# Patient Record
Sex: Male | Born: 1955 | Hispanic: No | Marital: Married | Smoking: Never smoker
Health system: Southern US, Community
[De-identification: ages and names within clinical notes are randomized; demographics above are authoritative.]

## PROBLEM LIST (undated history)

## (undated) DIAGNOSIS — T7840XA Allergy, unspecified, initial encounter: Secondary | ICD-10-CM

## (undated) DIAGNOSIS — J302 Other seasonal allergic rhinitis: Secondary | ICD-10-CM

## (undated) DIAGNOSIS — I10 Essential (primary) hypertension: Secondary | ICD-10-CM

## (undated) DIAGNOSIS — J45909 Unspecified asthma, uncomplicated: Secondary | ICD-10-CM

## (undated) DIAGNOSIS — E785 Hyperlipidemia, unspecified: Secondary | ICD-10-CM

## (undated) HISTORY — DX: Unspecified asthma, uncomplicated: J45.909

## (undated) HISTORY — DX: Essential (primary) hypertension: I10

## (undated) HISTORY — DX: Hyperlipidemia, unspecified: E78.5

## (undated) HISTORY — PX: CARDIAC VALVE SURGERY: SHX40

## (undated) HISTORY — PX: HERNIA REPAIR: SHX51

## (undated) HISTORY — DX: Allergy, unspecified, initial encounter: T78.40XA

## (undated) HISTORY — DX: Other seasonal allergic rhinitis: J30.2

---

## 1998-02-04 ENCOUNTER — Ambulatory Visit (HOSPITAL_COMMUNITY): Admission: RE | Admit: 1998-02-04 | Discharge: 1998-02-04 | Payer: Self-pay | Admitting: General Surgery

## 1999-12-06 ENCOUNTER — Inpatient Hospital Stay (HOSPITAL_COMMUNITY): Admission: RE | Admit: 1999-12-06 | Discharge: 1999-12-07 | Payer: Self-pay | Admitting: Orthopedic Surgery

## 1999-12-06 ENCOUNTER — Encounter: Payer: Self-pay | Admitting: Orthopedic Surgery

## 1999-12-06 ENCOUNTER — Encounter (INDEPENDENT_AMBULATORY_CARE_PROVIDER_SITE_OTHER): Payer: Self-pay | Admitting: Specialist

## 2008-11-16 ENCOUNTER — Ambulatory Visit: Payer: Self-pay | Admitting: Pulmonary Disease

## 2008-11-16 DIAGNOSIS — R0609 Other forms of dyspnea: Secondary | ICD-10-CM

## 2008-11-16 DIAGNOSIS — R0989 Other specified symptoms and signs involving the circulatory and respiratory systems: Secondary | ICD-10-CM

## 2010-09-01 NOTE — Op Note (Signed)
[place]  Patient:    Andrew Murray                 MRN: [id number] Proc. Date: 12/06/99 Adm. Date:  [id number] Attending:  Skip Mayer                           Operative Report  SURGEON:  Georges Lynch. Darrelyn Hillock, M.D.  ASSISTANT:  Illene Labrador. [place], M.D.  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 Note: The patient had a transitional vertebra, great care was taken to make sure that the films were labeled in the operating room exactly like they were preoperatively.  We had the radiologist confirm the space.  At this time, patient was given 1 g IV Ancef.  He was placed on the operating table and the spinal table.  Sterile prep and draping of his back was carried out.  Two needles were placed in the back for localization purposes and an x-ray was taken.  At this time, we identified the appropriate L5-S1 space and an incision was made, bleeders were identified and cauterized.  The muscle was stripped from the lamina and spinous process.  A self-retaining retractors were inserted.  We then went down, took another x-ray and verified the exact position and this was confirmed by the radiologist.  We then carried out a hemilaminectomy in the usual fashion of the L5-S1 on the left.  We removed the ligamentum flavum in the usual fashion.  We protected the dura at all times. We then went down and identified the dura and the S1 root and gently retracted those.  We then went down and noted a large herniated disk at L5-S1.  A cruciate incision was made in the disk space.  We then utilized the Epstein curets to literally tease out all of the herniated disk material.  We then inserted the pituitary rongeurs and completed the diskectomy.  We made multiple passes under the posterior longitudinal ligament and up under the nerve root to make sure there were no other fragments.  We went down, cleaned the space out of  all the loose material.  We had a very nice decompression. We decompressed the lateral recesses as well.  We probed the axilla of the S1 root with the Penfield 4 to make sure there were no loose fragments there either.  We then utilized the hockey-stick and traced the nerve down into the foramen and had a nice opening there.  We thoroughly irrigated out the area. Good hemostasis was maintained with thrombin-soaked Gelfoam.  The wound was closed in layers in usual fashion.  I injected about 6 cc of 0.5% Marcaine into the muscle site.  A sterile Neosporin dressing was applied.  The patient left the operating room in satisfactory condition. DD:  12/06/99 TD:  12/07/99 Job: [id number]

## 2017-09-20 ENCOUNTER — Other Ambulatory Visit: Payer: Self-pay

## 2017-09-20 ENCOUNTER — Ambulatory Visit (INDEPENDENT_AMBULATORY_CARE_PROVIDER_SITE_OTHER): Payer: Managed Care, Other (non HMO) | Admitting: Emergency Medicine

## 2017-09-20 ENCOUNTER — Encounter: Payer: Self-pay | Admitting: Emergency Medicine

## 2017-09-20 VITALS — HR 63 | Temp 98.4°F | Resp 16 | Ht 68.0 in | Wt 221.0 lb

## 2017-09-20 DIAGNOSIS — E785 Hyperlipidemia, unspecified: Secondary | ICD-10-CM | POA: Insufficient documentation

## 2017-09-20 DIAGNOSIS — Z0000 Encounter for general adult medical examination without abnormal findings: Secondary | ICD-10-CM | POA: Diagnosis not present

## 2017-09-20 DIAGNOSIS — I10 Essential (primary) hypertension: Secondary | ICD-10-CM | POA: Insufficient documentation

## 2017-09-20 NOTE — Patient Instructions (Addendum)
   IF you received an x-ray today, you will receive an invoice from [facility]. Please contact [facility] at [phone] with questions or concerns regarding your invoice.   IF you received labwork today, you will receive an invoice from [facility]. Please contact [facility] at [phone] with questions or concerns regarding your invoice.   Our billing staff will not be able to assist you with questions regarding bills from these companies.  You will be contacted with the lab results as soon as they are available. The fastest way to get your results is to activate your My Chart account. Instructions are located on the last page of this paperwork. If you have not heard from us regarding the results in 2 weeks, please contact this office.      Health Maintenance, Male A healthy lifestyle and preventive care is important for your health and wellness. Ask your health care provider about what schedule of regular examinations is right for you. What should I know about weight and diet? Eat a Healthy Diet  Eat plenty of vegetables, fruits, whole grains, low-fat dairy products, and lean protein.  Do not eat a lot of foods high in solid fats, added sugars, or salt.  Maintain a Healthy Weight Regular exercise can help you achieve or maintain a healthy weight. You should:  Do at least 150 minutes of exercise each week. The exercise should increase your heart rate and make you sweat (moderate-intensity exercise).  Do strength-training exercises at least twice a week.  Watch Your Levels of Cholesterol and Blood Lipids  Have your blood tested for lipids and cholesterol every 5 years starting at 63 years of age. If you are at high risk for heart disease, you should start having your blood tested when you are 63 years old. You may need to have your cholesterol levels checked more often if: ? Your lipid or cholesterol levels are high. ? You are older than 63 years of age. ? You  are at high risk for heart disease.  What should I know about cancer screening? Many types of cancers can be detected early and may often be prevented. Lung Cancer  You should be screened every year for lung cancer if: ? You are a current smoker who has smoked for at least 30 years. ? You are a former smoker who has quit within the past 15 years.  Talk to your health care provider about your screening options, when you should start screening, and how often you should be screened.  Colorectal Cancer  Routine colorectal cancer screening usually begins at 63 years of age and should be repeated every 5-10 years until you are 63 years old. You may need to be screened more often if early forms of precancerous polyps or small growths are found. Your health care provider may recommend screening at an earlier age if you have risk factors for colon cancer.  Your health care provider may recommend using home test kits to check for hidden blood in the stool.  A small camera at the end of a tube can be used to examine your colon (sigmoidoscopy or colonoscopy). This checks for the earliest forms of colorectal cancer.  Prostate and Testicular Cancer  Depending on your age and overall health, your health care provider may do certain tests to screen for prostate and testicular cancer.  Talk to your health care provider about any symptoms or concerns you have about testicular or prostate cancer.  Skin Cancer  Check your skin   from head to toe regularly.  Tell your health care provider about any new moles or changes in moles, especially if: ? There is a change in a mole's size, shape, or color. ? You have a mole that is larger than a pencil eraser.  Always use sunscreen. Apply sunscreen liberally and repeat throughout the day.  Protect yourself by wearing long sleeves, pants, a wide-brimmed hat, and sunglasses when outside.  What should I know about heart disease, diabetes, and high blood  pressure?  If you are 18-39 years of age, have your blood pressure checked every 3-5 years. If you are 40 years of age or older, have your blood pressure checked every year. You should have your blood pressure measured twice-once when you are at a hospital or clinic, and once when you are not at a hospital or clinic. Record the average of the two measurements. To check your blood pressure when you are not at a hospital or clinic, you can use: ? An automated blood pressure machine at a pharmacy. ? A home blood pressure monitor.  Talk to your health care provider about your target blood pressure.  If you are between 45-79 years old, ask your health care provider if you should take aspirin to prevent heart disease.  Have regular diabetes screenings by checking your fasting blood sugar level. ? If you are at a normal weight and have a low risk for diabetes, have this test once every three years after the age of 45. ? If you are overweight and have a high risk for diabetes, consider being tested at a younger age or more often.  A one-time screening for abdominal aortic aneurysm (AAA) by ultrasound is recommended for men aged 65-75 years who are current or former smokers. What should I know about preventing infection? Hepatitis B If you have a higher risk for hepatitis B, you should be screened for this virus. Talk with your health care provider to find out if you are at risk for hepatitis B infection. Hepatitis C Blood testing is recommended for:  Everyone born from 1945 through 1965.  Anyone with known risk factors for hepatitis C.  Sexually Transmitted Diseases (STDs)  You should be screened each year for STDs including gonorrhea and chlamydia if: ? You are sexually active and are younger than 63 years of age. ? You are older than 63 years of age and your health care provider tells you that you are at risk for this type of infection. ? Your sexual activity has changed since you were last  screened and you are at an increased risk for chlamydia or gonorrhea. Ask your health care provider if you are at risk.  Talk with your health care provider about whether you are at high risk of being infected with HIV. Your health care provider may recommend a prescription medicine to help prevent HIV infection.  What else can I do?  Schedule regular health, dental, and eye exams.  Stay current with your vaccines (immunizations).  Do not use any tobacco products, such as cigarettes, chewing tobacco, and e-cigarettes. If you need help quitting, ask your health care provider.  Limit alcohol intake to no more than 2 drinks per day. One drink equals 12 ounces of beer, 5 ounces of wine, or 1 ounces of hard liquor.  Do not use street drugs.  Do not share needles.  Ask your health care provider for help if you need support or information about quitting drugs.  Tell your health care   provider if you often feel depressed.  Tell your health care provider if you have ever been abused or do not feel safe at home. This information is not intended to replace advice given to you by your health care provider. Make sure you discuss any questions you have with your health care provider. Document Released: 09/29/2007 Document Revised: 11/30/2015 Document Reviewed: 01/04/2015 Elsevier Interactive Patient Education  2018 Elsevier Inc.  

## 2017-09-20 NOTE — Progress Notes (Signed)
[employer] 62 y.o.   Chief Complaint  Patient presents with  . Establish Care  . Annual Exam    per patient had breakfast an hour ago    HISTORY OF PRESENT ILLNESS: This is a 62 y.o. male Here for annual exam; no complaints and no medical concerns. Has history of hypertension and high cholesterol.  On medications.  From [place].  Last saw his doctor 1 to 2 months ago and everything was okay.  Will be traveling frequently.   HPI   Prior to Admission medications   Medication Sig Start Date End Date Taking? Authorizing Provider  aspirin EC 81 MG tablet Take 81 mg by mouth daily.   Yes [provider]  rosuvastatin (CRESTOR) 20 MG tablet Take 20 mg by mouth daily.   Yes [provider]    Allergies  Allergen Reactions  . Sulfonamide Derivatives     REACTION: rash    Patient Active Problem List   Diagnosis Date Noted  . SNORING 11/16/2008    No past medical history on file.    Social History   Socioeconomic History  . Marital status: Married    Spouse name: Not on file  . Number of children: Not on file  . Years of education: Not on file  . Highest education level: Not on file  Occupational History  . Not on file  Social Needs  . Financial resource strain: Not on file  . Food insecurity:    Worry: Not on file    Inability: Not on file  . Transportation needs:    Medical: Not on file    Non-medical: Not on file  Tobacco Use  . Smoking status: Never Smoker  . Smokeless tobacco: Never Used  Substance and Sexual Activity  . Alcohol use: Never    Frequency: Never  . Drug use: Never  . Sexual activity: Not on file  Lifestyle  . Physical activity:    Days per week: Not on file    Minutes per session: Not on file  . Stress: Not on file  Relationships  . Social connections:    Talks on phone: Not on file    Gets together: Not on file    Attends religious service: Not on file    Active member of club or organization: Not on file      Attends meetings of clubs or organizations: Not on file    Relationship status: Not on file  . Intimate partner violence:    Fear of current or ex partner: Not on file    Emotionally abused: Not on file    Physically abused: Not on file    Forced sexual activity: Not on file  Other Topics Concern  . Not on file  Social History Narrative  . Not on file    No family history on file.   Review of Systems  Constitutional: Negative.  Negative for chills, fever and malaise/fatigue.  HENT: Negative.  Negative for congestion, hearing loss, nosebleeds and sore throat.   Eyes: Negative.  Negative for blurred vision and double vision.  Respiratory: Negative.  Negative for cough and shortness of breath.   Cardiovascular: Negative.  Negative for chest pain and palpitations.  Gastrointestinal: Negative.  Negative for abdominal pain, nausea and vomiting.  Genitourinary: Negative.  Negative for dysuria and hematuria.  Musculoskeletal: Negative.  Negative for myalgias and neck pain.  Skin: Negative.  Negative for rash.  Neurological: Negative.  Negative for dizziness and headaches.  Endo/Heme/Allergies: Negative.   All other systems reviewed and are negative.   Vitals:   09/20/17 0903  Pulse: 63  Resp: 16  Temp: 98.4 F (36.9 C)  SpO2: 97%    Physical Exam  Constitutional: He is oriented to person, place, and time. He appears well-developed and well-nourished.  HENT:  Head: Normocephalic and atraumatic.  Right Ear: External ear normal.  Left Ear: External ear normal.  Nose: Nose normal.  Mouth/Throat: Oropharynx is clear and moist.  Eyes: Pupils are equal, round, and reactive to light. Conjunctivae and EOM are normal.  Neck: Normal range of motion. Neck supple. No JVD present. No thyromegaly present.  Cardiovascular: Normal rate, regular rhythm, normal heart sounds and intact distal pulses.  Pulmonary/Chest: Effort normal and breath sounds normal.  Abdominal: Soft. Bowel sounds  are normal. He exhibits no distension. There is no tenderness.  Musculoskeletal: Normal range of motion.  Neurological: He is alert and oriented to person, place, and time. No sensory deficit. He exhibits normal muscle tone.  Skin: Skin is warm and dry. Capillary refill takes less than 2 seconds.  Psychiatric: He has a normal mood and affect. His behavior is normal.  Vitals reviewed.    ASSESSMENT & PLAN: Karel Jarvis was seen today for establish care and annual exam.  Diagnoses and all orders for this visit:  Routine general medical examination at a health care facility -     Cancel: CBC with Differential -     Cancel: Comprehensive metabolic panel -     Cancel: Hemoglobin A1c -     Cancel: Lipid panel -     Cancel: PSA(Must document that pt has been informed of limitations of PSA testing.) -     Cancel: Hepatitis C antibody screen -     Cancel: HIV antibody -     CBC with Differential/Platelet; Future -     Comprehensive metabolic panel; Future -     Hemoglobin A1c; Future -     HIV antibody; Future -     Lipid panel; Future -     Hepatitis C antibody; Future  Essential hypertension  Dyslipidemia    Patient Instructions       IF you received an x-ray today, you will receive an invoice from [facility]. Please contact [facility] at [phone] with questions or concerns regarding your invoice.   IF you received labwork today, you will receive an invoice from [facility]. Please contact [facility] at [phone] with questions or concerns regarding your invoice.   Our billing staff will not be able to assist you with questions regarding bills from these companies.  You will be contacted with the lab results as soon as they are available. The fastest way to get your results is to activate your My Chart account. Instructions are located on the last page of this paperwork. If you have not heard from [place] regarding the results in 2 weeks, please contact this  office.      Health Maintenance, Male A healthy lifestyle and preventive care is important for your health and wellness. Ask your health care provider about what schedule of regular examinations is right for you. What should I know about weight and diet? Eat a Healthy Diet  Eat plenty of vegetables, fruits, whole grains, low-fat dairy products, and lean protein.  Do not eat a lot of foods high in solid fats, added sugars, or salt.  Maintain a Healthy Weight Regular exercise can help you achieve or maintain a healthy weight. You  should:  Do at least 150 minutes of exercise each week. The exercise should increase your heart rate and make you sweat (moderate-intensity exercise).  Do strength-training exercises at least twice a week.  Watch Your Levels of Cholesterol and Blood Lipids  Have your blood tested for lipids and cholesterol every 5 years starting at 63 years of age. If you are at high risk for heart disease, you should start having your blood tested when you are 63 years old. You may need to have your cholesterol levels checked more often if: ? Your lipid or cholesterol levels are high. ? You are older than 63 years of age. ? You are at high risk for heart disease.  What should I know about cancer screening? Many types of cancers can be detected early and may often be prevented. Lung Cancer  You should be screened every year for lung cancer if: ? You are a current smoker who has smoked for at least 30 years. ? You are a former smoker who has quit within the past 15 years.  Talk to your health care provider about your screening options, when you should start screening, and how often you should be screened.  Colorectal Cancer  Routine colorectal cancer screening usually begins at 63 years of age and should be repeated every 5-10 years until you are 63 years old. You may need to be screened more often if early forms of precancerous polyps or small growths are found. Your  health care provider may recommend screening at an earlier age if you have risk factors for colon cancer.  Your health care provider may recommend using home test kits to check for hidden blood in the stool.  A small camera at the end of a tube can be used to examine your colon (sigmoidoscopy or colonoscopy). This checks for the earliest forms of colorectal cancer.  Prostate and Testicular Cancer  Depending on your age and overall health, your health care provider may do certain tests to screen for prostate and testicular cancer.  Talk to your health care provider about any symptoms or concerns you have about testicular or prostate cancer.  Skin Cancer  Check your skin from head to toe regularly.  Tell your health care provider about any new moles or changes in moles, especially if: ? There is a change in a mole's size, shape, or color. ? You have a mole that is larger than a pencil eraser.  Always use sunscreen. Apply sunscreen liberally and repeat throughout the day.  Protect yourself by wearing long sleeves, pants, a wide-brimmed hat, and sunglasses when outside.  What should I know about heart disease, diabetes, and high blood pressure?  If you are 5418-63 years of age, have your blood pressure checked every 3-5 years. If you are 63 years of age or older, have your blood pressure checked every year. You should have your blood pressure measured twice-once when you are at a hospital or clinic, and once when you are not at a hospital or clinic. Record the average of the two measurements. To check your blood pressure when you are not at a hospital or clinic, you can use: ? An automated blood pressure machine at a pharmacy. ? A home blood pressure monitor.  Talk to your health care provider about your target blood pressure.  If you are between 645-63 years old, ask your health care provider if you should take aspirin to prevent heart disease.  Have regular diabetes screenings by  checking your  fasting blood sugar level. ? If you are at a normal weight and have a low risk for diabetes, have this test once every three years after the age of 24. ? If you are overweight and have a high risk for diabetes, consider being tested at a younger age or more often.  A one-time screening for abdominal aortic aneurysm (AAA) by ultrasound is recommended for men aged 65-75 years who are current or former smokers. What should I know about preventing infection? Hepatitis B If you have a higher risk for hepatitis B, you should be screened for this virus. Talk with your health care provider to find out if you are at risk for hepatitis B infection. Hepatitis C Blood testing is recommended for:  Everyone born from [birth year] through 1965.  Anyone with known risk factors for hepatitis C.  Sexually Transmitted Diseases (STDs)  You should be screened each year for STDs including gonorrhea and chlamydia if: ? You are sexually active and are younger than 63 years of age. ? You are older than 63 years of age and your health care provider tells you that you are at risk for this type of infection. ? Your sexual activity has changed since you were last screened and you are at an increased risk for chlamydia or gonorrhea. Ask your health care provider if you are at risk.  Talk with your health care provider about whether you are at high risk of being infected with HIV. Your health care provider may recommend a prescription medicine to help prevent HIV infection.  What else can I do?  Schedule regular health, dental, and eye exams.  Stay current with your vaccines (immunizations).  Do not use any tobacco products, such as cigarettes, chewing tobacco, and e-cigarettes. If you need help quitting, ask your health care provider.  Limit alcohol intake to no more than 2 drinks per day. One drink equals 12 ounces of beer, 5 ounces of wine, or 1 ounces of hard liquor.  Do not use street drugs.  Do not  share needles.  Ask your health care provider for help if you need support or information about quitting drugs.  Tell your health care provider if you often feel depressed.  Tell your health care provider if you have ever been abused or do not feel safe at home. This information is not intended to replace advice given to you by your health care provider. Make sure you discuss any questions you have with your health care provider. Document Released: 09/29/2007 Document Revised: 11/30/2015 Document Reviewed: 01/04/2015 Elsevier Interactive Patient Education  2018 [employer]vier Inc.      Edwina Barth, MD [facility]

## 2017-09-21 ENCOUNTER — Ambulatory Visit (INDEPENDENT_AMBULATORY_CARE_PROVIDER_SITE_OTHER): Payer: Managed Care, Other (non HMO) | Admitting: Emergency Medicine

## 2017-09-21 DIAGNOSIS — Z0000 Encounter for general adult medical examination without abnormal findings: Secondary | ICD-10-CM | POA: Diagnosis not present

## 2017-09-22 LAB — CBC WITH DIFFERENTIAL/PLATELET
BASOS ABS: 0 10*3/uL (ref 0.0–0.2)
Basos: 1 %
EOS (ABSOLUTE): 0.1 10*3/uL (ref 0.0–0.4)
EOS: 3 %
HEMOGLOBIN: 14.3 g/dL (ref 13.0–17.7)
Hematocrit: 43.3 % (ref 37.5–51.0)
IMMATURE GRANS (ABS): 0 10*3/uL (ref 0.0–0.1)
IMMATURE GRANULOCYTES: 0 %
LYMPHS ABS: 2.3 10*3/uL (ref 0.7–3.1)
LYMPHS: 42 %
MCH: 28.4 pg (ref 26.6–33.0)
MCHC: 33 g/dL (ref 31.5–35.7)
MCV: 86 fL (ref 79–97)
MONOCYTES: 9 %
Monocytes Absolute: 0.5 10*3/uL (ref 0.1–0.9)
NEUTROS PCT: 45 %
Neutrophils Absolute: 2.5 10*3/uL (ref 1.4–7.0)
Platelets: 279 10*3/uL (ref 150–450)
RBC: 5.03 x10E6/uL (ref 4.14–5.80)
RDW: 14.2 % (ref 12.3–15.4)
WBC: 5.4 10*3/uL (ref 3.4–10.8)

## 2017-09-22 LAB — HEPATITIS C ANTIBODY: Hep C Virus Ab: <0.1 s/co ratio (ref 0.0–0.9)

## 2017-09-22 LAB — LIPID PANEL
Chol/HDL Ratio: 2.9 ratio (ref 0.0–5.0)
Cholesterol, Total: 156 mg/dL (ref 100–199)
HDL: 53 mg/dL (ref >39)
LDL CALC: 84 mg/dL (ref 0–99)
Triglycerides: 93 mg/dL (ref 0–149)
VLDL CHOLESTEROL CAL: 19 mg/dL (ref 5–40)

## 2017-09-22 LAB — HEMOGLOBIN A1C
ESTIMATED AVERAGE GLUCOSE: 128 mg/dL
HEMOGLOBIN A1C: 6.1 % — AB (ref 4.8–5.6)

## 2017-09-22 LAB — COMPREHENSIVE METABOLIC PANEL
ALK PHOS: 61 IU/L (ref 39–117)
ALT: 21 IU/L (ref 0–44)
AST: 23 IU/L (ref 0–40)
Albumin/Globulin Ratio: 1.7 (ref 1.2–2.2)
Albumin: 4.5 g/dL (ref 3.6–4.8)
BILIRUBIN TOTAL: 0.7 mg/dL (ref 0.0–1.2)
BUN/Creatinine Ratio: 13 (ref 10–24)
BUN: 11 mg/dL (ref 8–27)
CHLORIDE: 103 mmol/L (ref 96–106)
CO2: 23 mmol/L (ref 20–29)
Calcium: 9.8 mg/dL (ref 8.6–10.2)
Creatinine, Ser: 0.88 mg/dL (ref 0.76–1.27)
GFR calc Af Amer: 106 mL/min/{1.73_m2} (ref >59)
GFR calc non Af Amer: 91 mL/min/{1.73_m2} (ref >59)
GLUCOSE: 99 mg/dL (ref 65–99)
Globulin, Total: 2.6 g/dL (ref 1.5–4.5)
Potassium: 4.5 mmol/L (ref 3.5–5.2)
Sodium: 140 mmol/L (ref 134–144)
Total Protein: 7.1 g/dL (ref 6.0–8.5)

## 2017-09-22 LAB — HIV ANTIBODY (ROUTINE TESTING W REFLEX): HIV SCREEN 4TH GENERATION: NONREACTIVE

## 2017-09-23 NOTE — Progress Notes (Signed)
Here for labs only

## 2017-11-01 ENCOUNTER — Other Ambulatory Visit: Payer: Self-pay

## 2017-11-01 ENCOUNTER — Encounter: Payer: Self-pay | Admitting: Emergency Medicine

## 2017-11-01 ENCOUNTER — Ambulatory Visit (INDEPENDENT_AMBULATORY_CARE_PROVIDER_SITE_OTHER): Payer: Managed Care, Other (non HMO) | Admitting: Emergency Medicine

## 2017-11-01 VITALS — BP 108/67 | HR 70 | Temp 98.7°F | Resp 16 | Ht 68.0 in | Wt 224.8 lb

## 2017-11-01 DIAGNOSIS — R399 Unspecified symptoms and signs involving the genitourinary system: Secondary | ICD-10-CM | POA: Insufficient documentation

## 2017-11-01 DIAGNOSIS — N400 Enlarged prostate without lower urinary tract symptoms: Secondary | ICD-10-CM

## 2017-11-01 DIAGNOSIS — Z23 Encounter for immunization: Secondary | ICD-10-CM | POA: Diagnosis not present

## 2017-11-01 LAB — POCT URINALYSIS DIP (MANUAL ENTRY)
BILIRUBIN UA: NEGATIVE
Blood, UA: NEGATIVE
Glucose, UA: NEGATIVE mg/dL
Ketones, POC UA: NEGATIVE mg/dL
Leukocytes, UA: NEGATIVE
Nitrite, UA: NEGATIVE
PH UA: 8 (ref 5.0–8.0)
Protein Ur, POC: NEGATIVE mg/dL
Spec Grav, UA: 1.015 (ref 1.010–1.025)
Urobilinogen, UA: 0.2 E.U./dL

## 2017-11-01 MED ORDER — DOXAZOSIN MESYLATE 2 MG PO TABS: 2.0000 mg | ORAL_TABLET | Freq: Every day | ORAL | 3 refills | Status: DC

## 2017-11-01 NOTE — Progress Notes (Signed)
Andrew Murray 62 y.o.   Chief Complaint  Patient presents with  . discuss prostate test    urinate then stop then starts again.  Per pt when he move around it feels like something is going to come out (urine)    HISTORY OF PRESENT ILLNESS: This is a 62 y.o. male complaining of urinary symptoms for several weeks.  Urinary stream is weak.  Unable to completely empty bladder.  Interrupted flow.  Gets up at least 3 times during the night to urinate.  Denies fever or chills.  Denies hematuria.  Denies pelvic or abdominal pain.  No other significant symptomatology  HPI   Prior to Admission medications   Medication Sig Start Date End Date Taking? Authorizing Provider  aspirin EC 81 MG tablet Take 81 mg by mouth daily.   Yes [provider]  rosuvastatin (CRESTOR) 20 MG tablet Take 20 mg by mouth daily.   Yes [provider]    Allergies  Allergen Reactions  . Sulfonamide Derivatives     REACTION: rash    Patient Active Problem List   Diagnosis Date Noted  . Essential hypertension 09/20/2017  . Dyslipidemia 09/20/2017  . SNORING 11/16/2008    Past Medical History:  Diagnosis Date  . Asthma   . Seasonal allergies     Past Surgical History:  Procedure Laterality Date  . HERNIA REPAIR      Social History   Socioeconomic History  . Marital status: Married    Spouse name: Not on file  . Number of children: Not on file  . Years of education: Not on file  . Highest education level: Not on file  Occupational History  . Not on file  Social Needs  . Financial resource strain: Not on file  . Food insecurity:    Worry: Not on file    Inability: Not on file  . Transportation needs:    Medical: Not on file    Non-medical: Not on file  Tobacco Use  . Smoking status: Never Smoker  . Smokeless tobacco: Never Used  Substance and Sexual Activity  . Alcohol use: Never    Frequency: Never  . Drug use: Never  . Sexual activity: Not on file    Lifestyle  . Physical activity:    Days per week: Not on file    Minutes per session: Not on file  . Stress: Not on file  Relationships  . Social connections:    Talks on phone: Not on file    Gets together: Not on file    Attends religious service: Not on file    Active member of club or organization: Not on file    Attends meetings of clubs or organizations: Not on file    Relationship status: Not on file  . Intimate partner violence:    Fear of current or ex partner: Not on file    Emotionally abused: Not on file    Physically abused: Not on file    Forced sexual activity: Not on file  Other Topics Concern  . Not on file  Social History Narrative  . Not on file    Family History  Problem Relation Age of Onset  . Diabetes Mother   . Diabetes Father   . Heart disease Father   . High Cholesterol Father      Review of Systems  Constitutional: Negative.  Negative for chills and fever.  HENT: Negative for sore throat.   Respiratory: Negative for cough  and shortness of breath.   Cardiovascular: Negative for chest pain and palpitations.  Gastrointestinal: Negative for abdominal pain, diarrhea, nausea and vomiting.  Genitourinary: Positive for frequency and urgency.  Skin: Negative.  Negative for rash.  Neurological: Negative.  Negative for dizziness and headaches.  Endo/Heme/Allergies: Negative.     Vitals:   11/01/17 1410  BP: 108/67  Pulse: 70  Resp: 16  Temp: 98.7 F (37.1 C)  SpO2: 97%    Physical Exam  Constitutional: He is oriented to person, place, and time. He appears well-developed and well-nourished.  HENT:  Head: Normocephalic.  Eyes: Pupils are equal, round, and reactive to light. EOM are normal.  Neck: Normal range of motion. Neck supple.  Cardiovascular: Normal rate and regular rhythm.  Pulmonary/Chest: Effort normal and breath sounds normal.  Abdominal: Soft. There is no tenderness.  Genitourinary: Prostate is enlarged. Prostate is not  tender.  Musculoskeletal: Normal range of motion.  Neurological: He is alert and oriented to person, place, and time. No sensory deficit. He exhibits normal muscle tone.  Skin: Skin is warm and dry. Capillary refill takes less than 2 seconds.  Psychiatric: He has a normal mood and affect. His behavior is normal.  Vitals reviewed.  Results for orders placed or performed in visit on 11/01/17 (from the past 24 hour(s))  POCT urinalysis dipstick     Status: None   Collection Time: 11/01/17  2:42 PM  Result Value Ref Range   Color, UA yellow yellow   Clarity, UA clear clear   Glucose, UA negative negative mg/dL   Bilirubin, UA negative negative   Ketones, POC UA negative negative mg/dL   Spec Grav, UA 1.610 9.604 - 1.025   Blood, UA negative negative   pH, UA 8.0 5.0 - 8.0   Protein Ur, POC negative negative mg/dL   Urobilinogen, UA 0.2 0.2 or 1.0 E.U./dL   Nitrite, UA Negative Negative   Leukocytes, UA Negative Negative    A total of 25 minutes was spent in the room with the patient, greater than 50% of which was in counseling/coordination of care regarding differential diagnosis, management, medications, need for follow-up with urologist.  ASSESSMENT & PLAN: Andrew Murray was seen today for discuss prostate test.  Diagnoses and all orders for this visit:  Lower urinary tract symptoms (LUTS) -     POCT urinalysis dipstick -     PSA -     Ambulatory referral to Urology  Need for Tdap vaccination -     Tdap vaccine greater than or equal to 7yo IM  Prostate enlargement  Other orders -     doxazosin (CARDURA) 2 MG tablet; Take 1 tablet (2 mg total) by mouth daily.     Patient Instructions       IF you received an x-ray today, you will receive an invoice from [facility]. Please contact [facility] at [phone] with questions or concerns regarding your invoice.   IF you received labwork today, you will receive an invoice from [facility]. Please contact [facility]  at [phone] with questions or concerns regarding your invoice.   Our billing staff will not be able to assist you with questions regarding bills from these companies.  You will be contacted with the lab results as soon as they are available. The fastest way to get your results is to activate your My Chart account. Instructions are located on the last page of this paperwork. If you have not heard from [place] regarding the results in 2 weeks,  please contact this office.     Benign Prostatic Hyperplasia Benign prostatic hyperplasia (BPH) is an enlarged prostate gland that is caused by the normal aging process and not by cancer. The prostate is a walnut-sized gland that is involved in the production of semen. It is located in front of the rectum and below the bladder. The bladder stores urine and the urethra is the tube that carries the urine out of the body. The prostate may get bigger as a man gets older. An enlarged prostate can press on the urethra. This can make it harder to pass urine. The build-up of urine in the bladder can cause infection. Back pressure and infection may progress to bladder damage and kidney (renal) failure. What are the causes? This condition is part of a normal aging process. However, not all men develop problems from this condition. If the prostate enlarges away from the urethra, urine flow will not be blocked. If it enlarges toward the urethra and compresses it, there will be problems passing urine. What increases the risk? This condition is more likely to develop in men over the age of 50 years. What are the signs or symptoms? Symptoms of this condition include:  Getting up often during the night to urinate.  Needing to urinate frequently during the day.  Difficulty starting urine flow.  Decrease in size and strength of your urine stream.  Leaking (dribbling) after urinating.  Inability to pass urine. This needs immediate treatment.  Inability to completely  empty your bladder.  Pain when you pass urine. This is more common if there is also an infection.  Urinary tract infection (UTI).  How is this diagnosed? This condition is diagnosed based on your medical history, a physical exam, and your symptoms. Tests will also be done, such as:  A post-void bladder scan. This measures any amount of urine that may remain in your bladder after you finish urinating.  A digital rectal exam. In a rectal exam, your health care provider checks your prostate by putting a lubricated, gloved finger into your rectum to feel the back of your prostate gland. This exam detects the size of your gland and any abnormal lumps or growths.  An exam of your urine (urinalysis).  A prostate specific antigen (PSA) screening. This is a blood test used to screen for prostate cancer.  An ultrasound. This test uses sound waves to electronically produce a picture of your prostate gland.  Your health care provider may refer you to a specialist in kidney and prostate diseases (urologist). How is this treated? Once symptoms begin, your health care provider will monitor your condition (active surveillance or watchful waiting). Treatment for this condition will depend on the severity of your condition. Treatment may include:  Observation and yearly exams. This may be the only treatment needed if your condition and symptoms are mild.  Medicines to relieve your symptoms, including: ? Medicines to shrink the prostate. ? Medicines to relax the muscle of the prostate.  Surgery in severe cases. Surgery may include: ? Prostatectomy. In this procedure, the prostate tissue is removed completely through an open incision or with a laparascope or robotics. ? Transurethral resection of the prostate (TURP). In this procedure, a tool is inserted through the opening at the tip of the penis (urethra). It is used to cut away tissue of the inner core of the prostate. The pieces are removed through  the same opening of the penis. This removes the blockage. ? Transurethral incision (TUIP). In this  procedure, small cuts are made in the prostate. This lessens the prostate's pressure on the urethra. ? Transurethral microwave thermotherapy (TUMT). This procedure uses microwaves to create heat. The heat destroys and removes a small amount of prostate tissue. ? Transurethral needle ablation (TUNA). This procedure uses radio frequencies to destroy and remove a small amount of prostate tissue. ? Interstitial laser coagulation (ILC). This procedure uses a laser to destroy and remove a small amount of prostate tissue. ? Transurethral electrovaporization (TUVP). This procedure uses electrodes to destroy and remove a small amount of prostate tissue. ? Prostatic urethral lift. This procedure inserts an implant to push the lobes of the prostate away from the urethra.  Follow these instructions at home:  Take over-the-counter and prescription medicines only as told by your health care provider.  Monitor your symptoms for any changes. Contact your health care provider with any changes.  Avoid drinking large amounts of liquid before going to bed or out in public.  Avoid or reduce how much caffeine or alcohol you drink.  Give yourself time when you urinate.  Keep all follow-up visits as told by your health care provider. This is important. Contact a health care provider if:  You have unexplained back pain.  Your symptoms do not get better with treatment.  You develop side effects from the medicine you are taking.  Your urine becomes very dark or has a bad smell.  Your lower abdomen becomes distended and you have trouble passing your urine. Get help right away if:  You have a fever or chills.  You suddenly cannot urinate.  You feel lightheaded, or very dizzy, or you faint.  There are large amounts of blood or clots in the urine.  Your urinary problems become hard to manage.  You develop  moderate to severe low back or flank pain. The flank is the side of your body between the ribs and the hip. These symptoms may represent a serious problem that is an emergency. Do not wait to see if the symptoms will go away. Get medical help right away. Call your local emergency services (911 in the [place] Do not drive yourself to the hospital. Summary  Benign prostatic hyperplasia (BPH) is an enlarged prostate that is caused by the normal aging process and not by cancer.  An enlarged prostate can press on the urethra. This can make it hard to pass urine.  This condition is part of a normal aging process and is more likely to develop in men over the age of 50 years.  Get help right away if you suddenly cannot urinate. This information is not intended to replace advice given to you by your health care provider. Make sure you discuss any questions you have with your health care provider. Document Released: 04/02/2005 Document Revised: 05/07/2016 Document Reviewed: 05/07/2016 Elsevier Interactive Patient Education  2018 [employer]     Edwina Barth, MD [facility]

## 2017-11-01 NOTE — Patient Instructions (Addendum)
   IF you received an x-ray today, you will receive an invoice from [facility]. Please contact [facility] at [phone] with questions or concerns regarding your invoice.   IF you received labwork today, you will receive an invoice from [facility]. Please contact [facility] at [phone] with questions or concerns regarding your invoice.   Our billing staff will not be able to assist you with questions regarding bills from these companies.  You will be contacted with the lab results as soon as they are available. The fastest way to get your results is to activate your My Chart account. Instructions are located on the last page of this paperwork. If you have not heard from us regarding the results in 2 weeks, please contact this office.     Benign Prostatic Hyperplasia Benign prostatic hyperplasia (BPH) is an enlarged prostate gland that is caused by the normal aging process and not by cancer. The prostate is a walnut-sized gland that is involved in the production of semen. It is located in front of the rectum and below the bladder. The bladder stores urine and the urethra is the tube that carries the urine out of the body. The prostate may get bigger as a man gets older. An enlarged prostate can press on the urethra. This can make it harder to pass urine. The build-up of urine in the bladder can cause infection. Back pressure and infection may progress to bladder damage and kidney (renal) failure. What are the causes? This condition is part of a normal aging process. However, not all men develop problems from this condition. If the prostate enlarges away from the urethra, urine flow will not be blocked. If it enlarges toward the urethra and compresses it, there will be problems passing urine. What increases the risk? This condition is more likely to develop in men over the age of 50 years. What are the signs or symptoms? Symptoms of this condition include:  Getting up  often during the night to urinate.  Needing to urinate frequently during the day.  Difficulty starting urine flow.  Decrease in size and strength of your urine stream.  Leaking (dribbling) after urinating.  Inability to pass urine. This needs immediate treatment.  Inability to completely empty your bladder.  Pain when you pass urine. This is more common if there is also an infection.  Urinary tract infection (UTI).  How is this diagnosed? This condition is diagnosed based on your medical history, a physical exam, and your symptoms. Tests will also be done, such as:  A post-void bladder scan. This measures any amount of urine that may remain in your bladder after you finish urinating.  A digital rectal exam. In a rectal exam, your health care provider checks your prostate by putting a lubricated, gloved finger into your rectum to feel the back of your prostate gland. This exam detects the size of your gland and any abnormal lumps or growths.  An exam of your urine (urinalysis).  A prostate specific antigen (PSA) screening. This is a blood test used to screen for prostate cancer.  An ultrasound. This test uses sound waves to electronically produce a picture of your prostate gland.  Your health care provider may refer you to a specialist in kidney and prostate diseases (urologist). How is this treated? Once symptoms begin, your health care provider will monitor your condition (active surveillance or watchful waiting). Treatment for this condition will depend on the severity of your condition. Treatment may include:  Observation and   yearly exams. This may be the only treatment needed if your condition and symptoms are mild.  Medicines to relieve your symptoms, including: ? Medicines to shrink the prostate. ? Medicines to relax the muscle of the prostate.  Surgery in severe cases. Surgery may include: ? Prostatectomy. In this procedure, the prostate tissue is removed completely  through an open incision or with a laparascope or robotics. ? Transurethral resection of the prostate (TURP). In this procedure, a tool is inserted through the opening at the tip of the penis (urethra). It is used to cut away tissue of the inner core of the prostate. The pieces are removed through the same opening of the penis. This removes the blockage. ? Transurethral incision (TUIP). In this procedure, small cuts are made in the prostate. This lessens the prostate's pressure on the urethra. ? Transurethral microwave thermotherapy (TUMT). This procedure uses microwaves to create heat. The heat destroys and removes a small amount of prostate tissue. ? Transurethral needle ablation (TUNA). This procedure uses radio frequencies to destroy and remove a small amount of prostate tissue. ? Interstitial laser coagulation (ILC). This procedure uses a laser to destroy and remove a small amount of prostate tissue. ? Transurethral electrovaporization (TUVP). This procedure uses electrodes to destroy and remove a small amount of prostate tissue. ? Prostatic urethral lift. This procedure inserts an implant to push the lobes of the prostate away from the urethra.  Follow these instructions at home:  Take over-the-counter and prescription medicines only as told by your health care provider.  Monitor your symptoms for any changes. Contact your health care provider with any changes.  Avoid drinking large amounts of liquid before going to bed or out in public.  Avoid or reduce how much caffeine or alcohol you drink.  Give yourself time when you urinate.  Keep all follow-up visits as told by your health care provider. This is important. Contact a health care provider if:  You have unexplained back pain.  Your symptoms do not get better with treatment.  You develop side effects from the medicine you are taking.  Your urine becomes very dark or has a bad smell.  Your lower abdomen becomes distended and  you have trouble passing your urine. Get help right away if:  You have a fever or chills.  You suddenly cannot urinate.  You feel lightheaded, or very dizzy, or you faint.  There are large amounts of blood or clots in the urine.  Your urinary problems become hard to manage.  You develop moderate to severe low back or flank pain. The flank is the side of your body between the ribs and the hip. These symptoms may represent a serious problem that is an emergency. Do not wait to see if the symptoms will go away. Get medical help right away. Call your local emergency services (911 in the [place] Do not drive yourself to the hospital. Summary  Benign prostatic hyperplasia (BPH) is an enlarged prostate that is caused by the normal aging process and not by cancer.  An enlarged prostate can press on the urethra. This can make it hard to pass urine.  This condition is part of a normal aging process and is more likely to develop in men over the age of 50 years.  Get help right away if you suddenly cannot urinate. This information is not intended to replace advice given to you by your health care provider. Make sure you discuss any questions you have with your health care   provider. Document Released: 04/02/2005 Document Revised: 05/07/2016 Document Reviewed: 05/07/2016 Elsevier Interactive Patient Education  2018 [employer]   

## 2017-11-02 LAB — PSA: Prostate Specific Ag, Serum: 1.1 ng/mL (ref 0.0–4.0)

## 2017-11-05 ENCOUNTER — Encounter: Payer: Self-pay | Admitting: Radiology

## 2017-11-12 ENCOUNTER — Ambulatory Visit (INDEPENDENT_AMBULATORY_CARE_PROVIDER_SITE_OTHER): Payer: Managed Care, Other (non HMO) | Admitting: Physician Assistant

## 2017-11-12 ENCOUNTER — Other Ambulatory Visit: Payer: Self-pay

## 2017-11-12 ENCOUNTER — Encounter: Payer: Self-pay | Admitting: Physician Assistant

## 2017-11-12 VITALS — BP 116/70 | HR 71 | Temp 99.2°F | Resp 16 | Ht 68.0 in | Wt 225.0 lb

## 2017-11-12 DIAGNOSIS — R1013 Epigastric pain: Secondary | ICD-10-CM

## 2017-11-12 DIAGNOSIS — K219 Gastro-esophageal reflux disease without esophagitis: Secondary | ICD-10-CM | POA: Diagnosis not present

## 2017-11-12 LAB — POCT CBC
Granulocyte percent: 59.5 %G (ref 37–80)
HCT, POC: 41.9 % — AB (ref 43.5–53.7)
Hemoglobin: 12.9 g/dL — AB (ref 14.1–18.1)
Lymph, poc: 1.9 (ref 0.6–3.4)
MCH, POC: 26.4 pg — AB (ref 27–31.2)
MCHC: 30.8 g/dL — AB (ref 31.8–35.4)
MCV: 85.6 fL (ref 80–97)
MID (cbc): 0.2 (ref 0–0.9)
MPV: 8.5 fL (ref 0–99.8)
POC Granulocyte: 3 (ref 2–6.9)
POC LYMPH PERCENT: 37 % (ref 10–50)
POC MID %: 3.5 %M (ref 0–12)
Platelet Count, POC: 257 10*3/uL (ref 142–424)
RBC: 4.89 M/uL (ref 4.69–6.13)
RDW, POC: 13.8 %
WBC: 5.1 10*3/uL (ref 4.6–10.2)

## 2017-11-12 MED ORDER — PANTOPRAZOLE SODIUM 40 MG PO TBEC: 40.0000 mg | DELAYED_RELEASE_TABLET | Freq: Every day | ORAL | 1 refills | Status: DC

## 2017-11-12 NOTE — Progress Notes (Signed)
Andrew Murray  MRN: [id number] DOB: 17-Jun-1955  PCP: Georgina Quint, MD  Subjective:  Pt is a 62 year old male PMH hypertension and high cholesterol who presents to clinic for reflux symptoms. C/o belching and gas. Symptoms worsened after eating at a [employer] with lots of meat 4 days ago. This is giving him trouble with sleeping at night.  He has had this problem for a long time, and it is getting worse. Now is happening every night - used to happen 1-2 times/week.  Pain is worse at night. Pain sometimes wakes him up at night.  Sometimes exercise makes it worse.  He has been taking Tums.  Sleeps with head of bed elevated.  Has never been seen for this problem.  Diet: eggs, fruit, salad, fish, chicken, meat. He exercises a few times/week.   Endorses a lot of constipation. He is taking a probiotic. This is helping with constipation.     Never smoker  Review of Systems  Respiratory: Negative for cough.   Cardiovascular: Negative for chest pain and palpitations.  Gastrointestinal: Positive for abdominal pain. Negative for blood in stool, constipation, diarrhea, nausea and vomiting.  Psychiatric/Behavioral: Positive for sleep disturbance.    Patient Active Problem List   Diagnosis Date Noted  . Lower urinary tract symptoms (LUTS) 11/01/2017  . Prostate enlargement 11/01/2017  . Essential hypertension 09/20/2017  . Dyslipidemia 09/20/2017  . SNORING 11/16/2008    Current Outpatient Medications on File Prior to Visit  Medication Sig Dispense Refill  . aspirin EC 81 MG tablet Take 81 mg by mouth daily.    . Bacillus Coagulans-Inulin (PROBIOTIC-PREBIOTIC PO) Take by mouth.    . bisoprolol (ZEBETA) 5 MG tablet Take 5 mg by mouth daily.    Marland Kitchen doxazosin (CARDURA) 2 MG tablet Take 1 tablet (2 mg total) by mouth daily. 30 tablet 3  . PERINDOPRIL ARG-AMLODIPINE PO Take 10 mg by mouth once.    . rosuvastatin (CRESTOR) 20 MG tablet Take 20 mg by mouth daily.      No current facility-administered medications on file prior to visit.     Allergies  Allergen Reactions  . Sulfonamide Derivatives     REACTION: rash     Objective:  BP 116/70 (BP Location: Left Arm, Patient Position: Sitting, Cuff Size: Large)   Pulse 71   Temp 99.2 F (37.3 C) (Oral)   Resp 16   Ht 5\' 8"  (1.727 m)   Wt 225 lb (102.1 kg)   SpO2 98%   BMI 34.21 kg/m   Physical Exam  Constitutional: He is oriented to person, place, and time. He appears well-developed and well-nourished.  Abdominal: Soft. Normal appearance and bowel sounds are normal. There is tenderness in the epigastric area. There is no rigidity and no guarding.  Central obesity  Neurological: He is alert and oriented to person, place, and time.  Skin: Skin is warm and dry.  Psychiatric: He has a normal mood and affect. His behavior is normal. Judgment and thought content normal.  Vitals reviewed.   Results for orders placed or performed in visit on 11/12/17  POCT CBC  Result Value Ref Range   WBC 5.1 4.6 - 10.2 K/uL   Lymph, poc 1.9 0.6 - 3.4   POC LYMPH PERCENT 37.0 10 - 50 %L   MID (cbc) 0.2 0 - 0.9   POC MID % 3.5 0 - 12 %M   POC Granulocyte 3.0 2 - 6.9   Granulocyte percent 59.5 37 -  80 %G   RBC 4.89 4.69 - 6.13 M/uL   Hemoglobin 12.9 (A) 14.1 - 18.1 g/dL   HCT, POC 28.441.9 (A) 13.243.5 - 53.7 %   MCV 85.6 80 - 97 fL   MCH, POC 26.4 (A) 27 - 31.2 pg   MCHC 30.8 (A) 31.8 - 35.4 g/dL   RDW, POC 44.013.8 %   Platelet Count, POC 257 142 - 424 K/uL   MPV 8.5 0 - 99.8 fL   Assessment and Plan :  1. Abdominal pain, epigastric - pt presents for worsening epigastric pain after eating a lot of meat at a [employer] restaurant. CBC shows mild anemia - suspect possible H Pylori - pending. Discussed avoidance of aggravating foods, plan to start Pantoprazole q am. RTC in 6 weeks for recheck.  - H. pylori breath test - POCT CBC  2. Gastroesophageal reflux disease, esophagitis presence not specified -  pantoprazole (PROTONIX) 40 MG tablet; Take 1 tablet (40 mg total) by mouth daily.  Dispense: 30 tablet; Refill: 1   Whitney Burnie Therien, PA-C  Primary Care at [facility] 11/12/2017 5:11 PM  Please note: Portions of this report may have been transcribed using dragon voice recognition software. Every effort was made to ensure accuracy; however, inadvertent computerized transcription errors may be present.

## 2017-11-12 NOTE — Patient Instructions (Addendum)
Start taking Pantoprazole 40mg  every morning 30 minutes before the first meal of the day. Do this for 6 weeks, then break the pill in half and take 20mg  every morning 30 minutes before the first meal of the day for the next two weeks.  Avoid foods that worsen your symptoms. See below for more home care tips.  I will contact you with your H Pylori results when they come back.  Come back and see me if you are not improving in 6-8 weeks.   Is there anything I can do on my own to improve my symptoms?-Yes. You might feel better if you: ?Lose weight (if you are overweight) ?Raise the head of your bed by 6 to 8 inches (for example, by putting blocks of wood or rubber under 2 legs of the bed or a Styrofoam wedge under the mattress) ?Avoid foods that make your symptoms worse (examples include coffee, chocolate, alcohol, peppermint, and fatty foods) ?Cut down on the amount of alcohol you drink ?Stop smoking, if you smoke. Saliva helps to neutralize refluxed acid, and smoking reduces the amount of saliva in the mouth and throat. Smoking also lowers the pressure in the lower esophageal sphincter and provokes coughing, causing frequent episodes of acid reflux in the esophagus ?Avoid lying down for 3 hours after a meal ?Avoid tight fitting clothing - At minimum, tight-fitting clothing can increase discomfort, but it may also increase pressure in the abdomen, forcing stomach contents into the esophagus. ?Chew gum or use oral lozenges - Chewing gum or using lozenges can increase saliva production, which may help to clear stomach acid that has entered the esophagus.   For constipation  Look up "6 Reasons to Care about Poop Health" at [employer] (http://www.edwards.info/www.precisionnutrition.com/blog/infographics)  1) Water: Make sure you are drinking enough water daily - about 1-3 liters. 2) Fiber: Make sure you are getting enough fiber in your diet - this will make you regular - you can eat high fiber foods or use  metamucil as a supplement - it is really important to drink enough water when using fiber supplements. Foods that have a lot of fiber include vegetables, fruits, beans, nuts, oatmeal, and some breads and cereals. You can tell how much fiber is in a food by reading the nutrition label. Doctors recommend eating 25 to 36 grams of fiber each day. 3) Fitness: Increasing your physical activity will help increase the natural movement of your bowels. Try to get 20-30 minutes of exercise daily.  4) Use a 9" stool in front of your toilet to rest your feet on while you have a bowel movement. This will loosen your rectal muscles to help your stool come out easier and prevent straining.   If your stools are hard or are formed balls or you have to strain a stool softener will help - use colace 2-3 capsule a day  Option 1) For gentle treatment of constipation Use Miralax 1-2 capfuls a day until your stools are soft and regular and then decrease the usage - you can use this daily   IF you received an x-ray today, you will receive an invoice from [facility]. Please contact [facility] at [phone] with questions or concerns regarding your invoice.   IF you received labwork today, you will receive an invoice from [facility]. Please contact [facility] at [phone] with questions or concerns regarding your invoice.   Our billing staff will not be able to assist you with questions regarding bills from these companies.  You  will be contacted with the lab results as soon as they are available. The fastest way to get your results is to activate your My Chart account. Instructions are located on the last page of this paperwork. If you have not heard from [place] regarding the results in 2 weeks, please contact this office.

## 2017-11-14 ENCOUNTER — Encounter: Payer: Self-pay | Admitting: Physician Assistant

## 2017-11-14 LAB — H. PYLORI BREATH TEST: H pylori Breath Test: NEGATIVE

## 2017-11-14 LAB — H. PYLORI BREATH COLLECTION

## 2017-11-14 NOTE — Progress Notes (Signed)
Negative H. pylori.  Letter sent in mail.  Will recheck symptoms in 6 to 8 weeks.

## 2017-11-25 ENCOUNTER — Other Ambulatory Visit: Payer: Self-pay | Admitting: Otolaryngology

## 2017-11-25 DIAGNOSIS — J330 Polyp of nasal cavity: Secondary | ICD-10-CM

## 2017-11-25 DIAGNOSIS — J349 Unspecified disorder of nose and nasal sinuses: Secondary | ICD-10-CM

## 2019-02-05 ENCOUNTER — Encounter (INDEPENDENT_AMBULATORY_CARE_PROVIDER_SITE_OTHER): Payer: Self-pay

## 2020-01-28 ENCOUNTER — Other Ambulatory Visit: Payer: Self-pay | Admitting: Emergency Medicine

## 2020-01-28 MED ORDER — BISOPROLOL FUMARATE 5 MG PO TABS: 5.0000 mg | ORAL_TABLET | Freq: Every day | ORAL | 0 refills | Status: DC

## 2020-01-28 NOTE — Telephone Encounter (Signed)
Requested Prescriptions  Pending Prescriptions Disp Refills  . bisoprolol (ZEBETA) 5 MG tablet      Sig: Take 1 tablet (5 mg total) by mouth daily.     Cardiovascular:  Beta Blockers Failed - 01/28/2020  1:15 PM      Failed - Valid encounter within last 6 months    Recent Outpatient Visits          2 years ago Abdominal pain, epigastric   Primary Care at [facility], Madelaine Bhat, PA-C   2 years ago Lower urinary tract symptoms (LUTS)   Primary Care at [facility], Eilleen Kempf, MD   2 years ago Routine general medical examination at a health care facility   Primary Care at [place], Eilleen Kempf, MD   2 years ago Routine general medical examination at a health care facility   Primary Care at [place], Eilleen Kempf, MD      Future Appointments            In 3 weeks Peyton Najjar, MD Primary Care at [facility], [facility]           Passed - Last BP in normal range    BP Readings from Last 1 Encounters:  11/12/17 116/70         Passed - Last Heart Rate in normal range    Pulse Readings from Last 1 Encounters:  11/12/17 71         . Perindopril Arg-amLODIPine 14-10 MG TABS      Sig: Take by mouth once for 1 dose.     Off-Protocol Failed - 01/28/2020  1:15 PM      Failed - Medication not assigned to a protocol, review manually.      Failed - Valid encounter within last 12 months    Recent Outpatient Visits          2 years ago Abdominal pain, epigastric   Primary Care at [facility], Madelaine Bhat, PA-C   2 years ago Lower urinary tract symptoms (LUTS)   Primary Care at [facility], Eilleen Kempf, MD   2 years ago Routine general medical examination at a health care facility   Primary Care at [place], Eilleen Kempf, MD   2 years ago Routine general medical examination at a health care facility   Primary Care at [facility], Eilleen Kempf, MD      Future Appointments            In 3 weeks Alwyn Ren, Sandria Bales, MD Primary Care at [place], [facility]

## 2020-01-28 NOTE — Telephone Encounter (Signed)
Medication Refill - Medication: bisoprolol (ZEBETA) 5 MG tablet ,PERINDOPRIL ARG-AMLODIPINE PO    Has the patient contacted their pharmacy?yes (Agent: If no, request that the patient contact the pharmacy for the refill.) (Agent: If yes, when and what did the pharmacy advise?)contact pcp  Preferred Pharmacy (with phone number or street name):  [id number] [place], [place] - [phone] AT [facility] [address] Phone:  [phone]  Fax:  [phone]       Agent: Please be advised that RX refills may take up to 3 business days. We ask that you follow-up with your pharmacy.

## 2020-01-28 NOTE — Telephone Encounter (Signed)
Requested medication (s) are due for refill today: yes  Requested medication (s) are on the active medication list: yres  Last refill:  ?  Future visit scheduled: yes  Notes to clinic: historical provider    Requested Prescriptions  Pending Prescriptions Disp Refills   Perindopril Arg-amLODIPine 14-10 MG TABS      Sig: Take by mouth once for 1 dose.      Off-Protocol Failed - 01/28/2020  1:15 PM      Failed - Medication not assigned to a protocol, review manually.      Failed - Valid encounter within last 12 months    Recent Outpatient Visits           2 years ago Abdominal pain, epigastric   Primary Care at [facility], Madelaine Bhat, PA-C   2 years ago Lower urinary tract symptoms (LUTS)   Primary Care at [facility], Eilleen Kempf, MD   2 years ago Routine general medical examination at a health care facility   Primary Care at [place], Eilleen Kempf, MD   2 years ago Routine general medical examination at a health care facility   Primary Care at [place], Eilleen Kempf, MD       Future Appointments             In 3 weeks Alwyn Ren Sandria Bales, MD Primary Care at [place], [facility]             Signed Prescriptions Disp Refills   bisoprolol (ZEBETA) 5 MG tablet 30 tablet 0    Sig: Take 1 tablet (5 mg total) by mouth daily.      Cardiovascular:  Beta Blockers Failed - 01/28/2020  1:15 PM      Failed - Valid encounter within last 6 months    Recent Outpatient Visits           2 years ago Abdominal pain, epigastric   Primary Care at [facility], Madelaine Bhat, PA-C   2 years ago Lower urinary tract symptoms (LUTS)   Primary Care at [facility], Eilleen Kempf, MD   2 years ago Routine general medical examination at a health care facility   Primary Care at [place], Eilleen Kempf, MD   2 years ago Routine general medical examination at a health care facility   Primary Care at [place], Eilleen Kempf, MD       Future Appointments              In 3 weeks Peyton Najjar, MD Primary Care at [facility], [facility]            Passed - Last BP in normal range    BP Readings from Last 1 Encounters:  11/12/17 116/70          Passed - Last Heart Rate in normal range    Pulse Readings from Last 1 Encounters:  11/12/17 71

## 2020-01-29 ENCOUNTER — Telehealth: Payer: Self-pay | Admitting: Emergency Medicine

## 2020-01-29 NOTE — Telephone Encounter (Signed)
Patient requesting  A medicine covuram prescribed by a MD in [place]   I will try to schedule  An appt with his provider

## 2020-01-29 NOTE — Telephone Encounter (Signed)
All new medication needs a visit will have to wait for appt.

## 2020-02-03 ENCOUNTER — Encounter: Payer: Self-pay | Admitting: Emergency Medicine

## 2020-02-03 ENCOUNTER — Ambulatory Visit (INDEPENDENT_AMBULATORY_CARE_PROVIDER_SITE_OTHER): Payer: Managed Care, Other (non HMO) | Admitting: Emergency Medicine

## 2020-02-03 ENCOUNTER — Other Ambulatory Visit: Payer: Self-pay

## 2020-02-03 VITALS — BP 96/61 | HR 63 | Temp 98.1°F | Resp 16 | Ht 68.0 in | Wt 226.0 lb

## 2020-02-03 DIAGNOSIS — I10 Essential (primary) hypertension: Secondary | ICD-10-CM

## 2020-02-03 MED ORDER — PERINDOPRIL ARG-AMLODIPINE 7-5 MG PO TABS: 1.0000 | ORAL_TABLET | Freq: Once | ORAL | 3 refills | Status: AC

## 2020-02-03 MED ORDER — BISOPROLOL FUMARATE 5 MG PO TABS: 5.0000 mg | ORAL_TABLET | Freq: Every day | ORAL | 3 refills | Status: DC

## 2020-02-03 MED ORDER — CETIRIZINE HCL 10 MG PO TABS: 10.0000 mg | ORAL_TABLET | Freq: Every day | ORAL | 11 refills | Status: AC

## 2020-02-03 NOTE — Patient Instructions (Addendum)
   If you have lab work done today you will be contacted with your lab results within the next 2 weeks.  If you have not heard from us then please contact us. The fastest way to get your results is to register for My Chart.   IF you received an x-ray today, you will receive an invoice from [facility]. Please contact [facility] at [phone] with questions or concerns regarding your invoice.   IF you received labwork today, you will receive an invoice from [facility]. Please contact [facility] at [phone] with questions or concerns regarding your invoice.   Our billing staff will not be able to assist you with questions regarding bills from these companies.  You will be contacted with the lab results as soon as they are available. The fastest way to get your results is to activate your My Chart account. Instructions are located on the last page of this paperwork. If you have not heard from us regarding the results in 2 weeks, please contact this office.     Hypertension, Adult High blood pressure (hypertension) is when the force of blood pumping through the arteries is too strong. The arteries are the blood vessels that carry blood from the heart throughout the body. Hypertension forces the heart to work harder to pump blood and may cause arteries to become narrow or stiff. Untreated or uncontrolled hypertension can cause a heart attack, heart failure, a stroke, kidney disease, and other problems. A blood pressure reading consists of a higher number over a lower number. Ideally, your blood pressure should be below 120/80. The first ("top") number is called the systolic pressure. It is a measure of the pressure in your arteries as your heart beats. The second ("bottom") number is called the diastolic pressure. It is a measure of the pressure in your arteries as the heart relaxes. What are the causes? The exact cause of this condition is not known. There are some conditions  that result in or are related to high blood pressure. What increases the risk? Some risk factors for high blood pressure are under your control. The following factors may make you more likely to develop this condition:  Smoking.  Having type 2 diabetes mellitus, high cholesterol, or both.  Not getting enough exercise or physical activity.  Being overweight.  Having too much fat, sugar, calories, or salt (sodium) in your diet.  Drinking too much alcohol. Some risk factors for high blood pressure may be difficult or impossible to change. Some of these factors include:  Having chronic kidney disease.  Having a family history of high blood pressure.  Age. Risk increases with age.  Race. You may be at higher risk if you are African American.  Gender. Men are at higher risk than women before age 45. After age 65, women are at higher risk than men.  Having obstructive sleep apnea.  Stress. What are the signs or symptoms? High blood pressure may not cause symptoms. Very high blood pressure (hypertensive crisis) may cause:  Headache.  Anxiety.  Shortness of breath.  Nosebleed.  Nausea and vomiting.  Vision changes.  Severe chest pain.  Seizures. How is this diagnosed? This condition is diagnosed by measuring your blood pressure while you are seated, with your arm resting on a flat surface, your legs uncrossed, and your feet flat on the floor. The cuff of the blood pressure monitor will be placed directly against the skin of your upper arm at the level of your heart.   It should be measured at least twice using the same arm. Certain conditions can cause a difference in blood pressure between your right and left arms. Certain factors can cause blood pressure readings to be lower or higher than normal for a short period of time:  When your blood pressure is higher when you are in a health care provider's office than when you are at home, this is called white coat hypertension.  Most people with this condition do not need medicines.  When your blood pressure is higher at home than when you are in a health care provider's office, this is called masked hypertension. Most people with this condition may need medicines to control blood pressure. If you have a high blood pressure reading during one visit or you have normal blood pressure with other risk factors, you may be asked to:  Return on a different day to have your blood pressure checked again.  Monitor your blood pressure at home for 1 week or longer. If you are diagnosed with hypertension, you may have other blood or imaging tests to help your health care provider understand your overall risk for other conditions. How is this treated? This condition is treated by making healthy lifestyle changes, such as eating healthy foods, exercising more, and reducing your alcohol intake. Your health care provider may prescribe medicine if lifestyle changes are not enough to get your blood pressure under control, and if:  Your systolic blood pressure is above 130.  Your diastolic blood pressure is above 80. Your personal target blood pressure may vary depending on your medical conditions, your age, and other factors. Follow these instructions at home: Eating and drinking   Eat a diet that is high in fiber and potassium, and low in sodium, added sugar, and fat. An example eating plan is called the DASH (Dietary Approaches to Stop Hypertension) diet. To eat this way: ? Eat plenty of fresh fruits and vegetables. Try to fill one half of your plate at each meal with fruits and vegetables. ? Eat whole grains, such as whole-wheat pasta, brown rice, or whole-grain bread. Fill about one fourth of your plate with whole grains. ? Eat or drink low-fat dairy products, such as skim milk or low-fat yogurt. ? Avoid fatty cuts of meat, processed or cured meats, and poultry with skin. Fill about one fourth of your plate with lean proteins, such  as fish, chicken without skin, beans, eggs, or tofu. ? Avoid pre-made and processed foods. These tend to be higher in sodium, added sugar, and fat.  Reduce your daily sodium intake. Most people with hypertension should eat less than 1,500 mg of sodium a day.  Do not drink alcohol if: ? Your health care provider tells you not to drink. ? You are pregnant, may be pregnant, or are planning to become pregnant.  If you drink alcohol: ? Limit how much you use to:  0-1 drink a day for women.  0-2 drinks a day for men. ? Be aware of how much alcohol is in your drink. In the [place] one drink equals one 12 oz bottle of beer (355 mL), one 5 oz glass of wine (148 mL), or one 1 oz glass of hard liquor (44 mL). Lifestyle   Work with your health care provider to maintain a healthy body weight or to lose weight. Ask what an ideal weight is for you.  Get at least 30 minutes of exercise most days of the week. Activities may include walking, swimming, or   biking.  Include exercise to strengthen your muscles (resistance exercise), such as Pilates or lifting weights, as part of your weekly exercise routine. Try to do these types of exercises for 30 minutes at least 3 days a week.  Do not use any products that contain nicotine or tobacco, such as cigarettes, e-cigarettes, and chewing tobacco. If you need help quitting, ask your health care provider.  Monitor your blood pressure at home as told by your health care provider.  Keep all follow-up visits as told by your health care provider. This is important. Medicines  Take over-the-counter and prescription medicines only as told by your health care provider. Follow directions carefully. Blood pressure medicines must be taken as prescribed.  Do not skip doses of blood pressure medicine. Doing this puts you at risk for problems and can make the medicine less effective.  Ask your health care provider about side effects or reactions to medicines that you  should watch for. Contact a health care provider if you:  Think you are having a reaction to a medicine you are taking.  Have headaches that keep coming back (recurring).  Feel dizzy.  Have swelling in your ankles.  Have trouble with your vision. Get help right away if you:  Develop a severe headache or confusion.  Have unusual weakness or numbness.  Feel faint.  Have severe pain in your chest or abdomen.  Vomit repeatedly.  Have trouble breathing. Summary  Hypertension is when the force of blood pumping through your arteries is too strong. If this condition is not controlled, it may put you at risk for serious complications.  Your personal target blood pressure may vary depending on your medical conditions, your age, and other factors. For most people, a normal blood pressure is less than 120/80.  Hypertension is treated with lifestyle changes, medicines, or a combination of both. Lifestyle changes include losing weight, eating a healthy, low-sodium diet, exercising more, and limiting alcohol. This information is not intended to replace advice given to you by your health care provider. Make sure you discuss any questions you have with your health care provider. Document Revised: 12/11/2017 Document Reviewed: 12/11/2017 Elsevier Patient Education  2020 [employer].  Health Maintenance After Age 27 After age 10, you are at a higher risk for certain long-term diseases and infections as well as injuries from falls. Falls are a major cause of broken bones and head injuries in people who are older than age 70. Getting regular preventive care can help to keep you healthy and well. Preventive care includes getting regular testing and making lifestyle changes as recommended by your health care provider. Talk with your health care provider about:  Which screenings and tests you should have. A screening is a test that checks for a disease when you have no symptoms.  A diet and  exercise plan that is right for you. What should I know about screenings and tests to prevent falls? Screening and testing are the best ways to find a health problem early. Early diagnosis and treatment give you the best chance of managing medical conditions that are common after age 19. Certain conditions and lifestyle choices may make you more likely to have a fall. Your health care provider may recommend:  Regular vision checks. Poor vision and conditions such as cataracts can make you more likely to have a fall. If you wear glasses, make sure to get your prescription updated if your vision changes.  Medicine review. Work with your health care provider  to regularly review all of the medicines you are taking, including over-the-counter medicines. Ask your health care provider about any side effects that may make you more likely to have a fall. Tell your health care provider if any medicines that you take make you feel dizzy or sleepy.  Osteoporosis screening. Osteoporosis is a condition that causes the bones to get weaker. This can make the bones weak and cause them to break more easily.  Blood pressure screening. Blood pressure changes and medicines to control blood pressure can make you feel dizzy.  Strength and balance checks. Your health care provider may recommend certain tests to check your strength and balance while standing, walking, or changing positions.  Foot health exam. Foot pain and numbness, as well as not wearing proper footwear, can make you more likely to have a fall.  Depression screening. You may be more likely to have a fall if you have a fear of falling, feel emotionally low, or feel unable to do activities that you used to do.  Alcohol use screening. Using too much alcohol can affect your balance and may make you more likely to have a fall. What actions can I take to lower my risk of falls? General instructions  Talk with your health care provider about your risks for  falling. Tell your health care provider if: ? You fall. Be sure to tell your health care provider about all falls, even ones that seem minor. ? You feel dizzy, sleepy, or off-balance.  Take over-the-counter and prescription medicines only as told by your health care provider. These include any supplements.  Eat a healthy diet and maintain a healthy weight. A healthy diet includes low-fat dairy products, low-fat (lean) meats, and fiber from whole grains, beans, and lots of fruits and vegetables. Home safety  Remove any tripping hazards, such as rugs, cords, and clutter.  Install safety equipment such as grab bars in bathrooms and safety rails on stairs.  Keep rooms and walkways well-lit. Activity   Follow a regular exercise program to stay fit. This will help you maintain your balance. Ask your health care provider what types of exercise are appropriate for you.  If you need a cane or walker, use it as recommended by your health care provider.  Wear supportive shoes that have nonskid soles. Lifestyle  Do not drink alcohol if your health care provider tells you not to drink.  If you drink alcohol, limit how much you have: ? 0-1 drink a day for women. ? 0-2 drinks a day for men.  Be aware of how much alcohol is in your drink. In the [place] one drink equals one typical bottle of beer (12 oz), one-half glass of wine (5 oz), or one shot of hard liquor (1 oz).  Do not use any products that contain nicotine or tobacco, such as cigarettes and e-cigarettes. If you need help quitting, ask your health care provider. Summary  Having a healthy lifestyle and getting preventive care can help to protect your health and wellness after age 45.  Screening and testing are the best way to find a health problem early and help you avoid having a fall. Early diagnosis and treatment give you the best chance for managing medical conditions that are more common for people who are older than age 45.  Falls  are a major cause of broken bones and head injuries in people who are older than age 61. Take precautions to prevent a fall at home.  Work with your  health care provider to learn what changes you can make to improve your health and wellness and to prevent falls. This information is not intended to replace advice given to you by your health care provider. Make sure you discuss any questions you have with your health care provider. Document Revised: 07/24/2018 Document Reviewed: 02/13/2017 Elsevier Patient Education  2020 [employer].

## 2020-02-03 NOTE — Progress Notes (Signed)
Andrew Murray 64 y.o.   Chief Complaint  Patient presents with  . Medication Refill    HISTORY OF PRESENT ILLNESS: This is a 64 y.o. male with history of hypertension here for medication refill. Has no complaints or medical concerns today.  HPI   Prior to Admission medications   Medication Sig Start Date End Date Taking? Authorizing Provider  aspirin EC 81 MG tablet Take 81 mg by mouth daily.    [provider]  Bacillus Coagulans-Inulin (PROBIOTIC-PREBIOTIC PO) Take by mouth.    [provider]  bisoprolol (ZEBETA) 5 MG tablet Take 1 tablet (5 mg total) by mouth daily. 01/28/20   Georgina Quint, MD  doxazosin (CARDURA) 2 MG tablet Take 1 tablet (2 mg total) by mouth daily. 11/01/17   Georgina Quint, MD  pantoprazole (PROTONIX) 40 MG tablet Take 1 tablet (40 mg total) by mouth daily. 11/12/17   McVey, Madelaine Bhat, PA-C  PERINDOPRIL ARG-AMLODIPINE PO Take 10 mg by mouth once.    [provider]  rosuvastatin (CRESTOR) 20 MG tablet Take 20 mg by mouth daily.    [provider]    Allergies  Allergen Reactions  . Sulfonamide Derivatives     REACTION: rash    Patient Active Problem List   Diagnosis Date Noted  . Lower urinary tract symptoms (LUTS) 11/01/2017  . Prostate enlargement 11/01/2017  . Essential hypertension 09/20/2017  . Dyslipidemia 09/20/2017  . SNORING 11/16/2008    Past Medical History:  Diagnosis Date  . Asthma   . Seasonal allergies     Past Surgical History:  Procedure Laterality Date  . HERNIA REPAIR      Social History   Socioeconomic History  . Marital status: Married    Spouse name: Not on file  . Number of children: Not on file  . Years of education: Not on file  . Highest education level: Not on file  Occupational History  . Not on file  Tobacco Use  . Smoking status: Never Smoker  . Smokeless tobacco: Never Used  Substance and Sexual Activity  . Alcohol use: Never  .  Drug use: Never  . Sexual activity: Not on file  Other Topics Concern  . Not on file  Social History Narrative  . Not on file   Social Determinants of Health   Financial Resource Strain:   . Difficulty of Paying Living Expenses: Not on file  Food Insecurity:   . Worried About Programme researcher, broadcasting/film/video in the Last Year: Not on file  . Ran Out of Food in the Last Year: Not on file  Transportation Needs:   . Lack of Transportation (Medical): Not on file  . Lack of Transportation (Non-Medical): Not on file  Physical Activity:   . Days of Exercise per Week: Not on file  . Minutes of Exercise per Session: Not on file  Stress:   . Feeling of Stress : Not on file  Social Connections:   . Frequency of Communication with Friends and Family: Not on file  . Frequency of Social Gatherings with Friends and Family: Not on file  . Attends Religious Services: Not on file  . Active Member of Clubs or Organizations: Not on file  . Attends Banker Meetings: Not on file  . Marital Status: Not on file  Intimate Partner Violence:   . Fear of Current or Ex-Partner: Not on file  . Emotionally Abused: Not on file  . Physically Abused: Not on  file  . Sexually Abused: Not on file    Family History  Problem Relation Age of Onset  . Diabetes Mother   . Diabetes Father   . Heart disease Father   . High Cholesterol Father      Review of Systems  Constitutional: Negative.  Negative for chills and fever.  HENT: Negative.  Negative for congestion and sore throat.   Respiratory: Negative.  Negative for cough and sputum production.   Cardiovascular: Negative.  Negative for chest pain and palpitations.  Gastrointestinal: Negative.  Negative for abdominal pain, blood in stool, diarrhea, melena, nausea and vomiting.  Genitourinary: Negative.  Negative for dysuria and hematuria.  Musculoskeletal: Negative.  Negative for back pain, myalgias and neck pain.  Skin: Negative.  Negative for rash.    Neurological: Negative.  Negative for dizziness and headaches.  All other systems reviewed and are negative.   Today's Vitals   02/03/20 1434  BP: 96/61  Pulse: 63  Resp: 16  Temp: 98.1 F (36.7 C)  TempSrc: Temporal  SpO2: 97%  Weight: 226 lb (102.5 kg)  Height: 5\' 8"  (1.727 m)   Body mass index is 34.36 kg/m. Wt Readings from Last 3 Encounters:  02/03/20 226 lb (102.5 kg)  11/12/17 225 lb (102.1 kg)  11/01/17 224 lb 12.8 oz (102 kg)    Physical Exam Vitals reviewed.  Constitutional:      Appearance: Normal appearance.  HENT:     Head: Normocephalic.  Eyes:     Extraocular Movements: Extraocular movements intact.     Conjunctiva/sclera: Conjunctivae normal.     Pupils: Pupils are equal, round, and reactive to light.  Neck:     Vascular: No carotid bruit.  Cardiovascular:     Rate and Rhythm: Normal rate and regular rhythm.     Pulses: Normal pulses.     Heart sounds: Normal heart sounds.  Pulmonary:     Effort: Pulmonary effort is normal.     Breath sounds: Normal breath sounds.  Musculoskeletal:        General: Normal range of motion.     Cervical back: Normal range of motion and neck supple. No tenderness.  Lymphadenopathy:     Cervical: No cervical adenopathy.  Skin:    General: Skin is warm and dry.  Neurological:     General: No focal deficit present.     Mental Status: He is alert and oriented to person, place, and time.  Psychiatric:        Mood and Affect: Mood normal.        Behavior: Behavior normal.    A total of 30 minutes was spent with the patient, greater than 50% of which was in counseling/coordination of care regarding hypertension and cardiovascular risks associated with this condition, review of all medications, review of most recent office visit notes, review of most recent blood work results, education on nutrition, documentation, prognosis, health maintenance items, and need for follow-up.   ASSESSMENT & PLAN: Clinically stable.   No medical concerns identified during this visit. Continue present medications.  No changes. Follow-up in 3 to 6 months. Andrew Murray was seen today for medication refill.  Diagnoses and all orders for this visit:  Essential hypertension -     Perindopril Arg-amLODIPine 7-5 MG TABS; Take 1 tablet by mouth once for 1 dose. -     bisoprolol (ZEBETA) 5 MG tablet; Take 1 tablet (5 mg total) by mouth daily.  Other orders -     cetirizine (ZYRTEC)  10 MG tablet; Take 1 tablet (10 mg total) by mouth daily.    Patient Instructions       If you have lab work done today you will be contacted with your lab results within the next 2 weeks.  If you have not heard from [place] then please contact us. The fastest way to get your results is to register for My Chart.   IF you received an x-ray today, you will receive an invoice from [facility]. Please contact [facility] at [phone] with questions or concerns regarding your invoice.   IF you received labwork today, you will receive an invoice from [facility]. Please contact [facility] at [phone] with questions or concerns regarding your invoice.   Our billing staff will not be able to assist you with questions regarding bills from these companies.  You will be contacted with the lab results as soon as they are available. The fastest way to get your results is to activate your My Chart account. Instructions are located on the last page of this paperwork. If you have not heard from [place] regarding the results in 2 weeks, please contact this office.     Hypertension, Adult High blood pressure (hypertension) is when the force of blood pumping through the arteries is too strong. The arteries are the blood vessels that carry blood from the heart throughout the body. Hypertension forces the heart to work harder to pump blood and may cause arteries to become narrow or stiff. Untreated or uncontrolled hypertension can cause a heart attack, heart  failure, a stroke, kidney disease, and other problems. A blood pressure reading consists of a higher number over a lower number. Ideally, your blood pressure should be below 120/80. The first ("top") number is called the systolic pressure. It is a measure of the pressure in your arteries as your heart beats. The second ("bottom") number is called the diastolic pressure. It is a measure of the pressure in your arteries as the heart relaxes. What are the causes? The exact cause of this condition is not known. There are some conditions that result in or are related to high blood pressure. What increases the risk? Some risk factors for high blood pressure are under your control. The following factors may make you more likely to develop this condition:  Smoking.  Having type 2 diabetes mellitus, high cholesterol, or both.  Not getting enough exercise or physical activity.  Being overweight.  Having too much fat, sugar, calories, or salt (sodium) in your diet.  Drinking too much alcohol. Some risk factors for high blood pressure may be difficult or impossible to change. Some of these factors include:  Having chronic kidney disease.  Having a family history of high blood pressure.  Age. Risk increases with age.  Race. You may be at higher risk if you are African American.  Gender. Men are at higher risk than women before age 92. After age 89, women are at higher risk than men.  Having obstructive sleep apnea.  Stress. What are the signs or symptoms? High blood pressure may not cause symptoms. Very high blood pressure (hypertensive crisis) may cause:  Headache.  Anxiety.  Shortness of breath.  Nosebleed.  Nausea and vomiting.  Vision changes.  Severe chest pain.  Seizures. How is this diagnosed? This condition is diagnosed by measuring your blood pressure while you are seated, with your arm resting on a flat surface, your legs uncrossed, and your feet flat on the floor.  The cuff of  the blood pressure monitor will be placed directly against the skin of your upper arm at the level of your heart. It should be measured at least twice using the same arm. Certain conditions can cause a difference in blood pressure between your right and left arms. Certain factors can cause blood pressure readings to be lower or higher than normal for a short period of time:  When your blood pressure is higher when you are in a health care provider's office than when you are at home, this is called white coat hypertension. Most people with this condition do not need medicines.  When your blood pressure is higher at home than when you are in a health care provider's office, this is called masked hypertension. Most people with this condition may need medicines to control blood pressure. If you have a high blood pressure reading during one visit or you have normal blood pressure with other risk factors, you may be asked to:  Return on a different day to have your blood pressure checked again.  Monitor your blood pressure at home for 1 week or longer. If you are diagnosed with hypertension, you may have other blood or imaging tests to help your health care provider understand your overall risk for other conditions. How is this treated? This condition is treated by making healthy lifestyle changes, such as eating healthy foods, exercising more, and reducing your alcohol intake. Your health care provider may prescribe medicine if lifestyle changes are not enough to get your blood pressure under control, and if:  Your systolic blood pressure is above 130.  Your diastolic blood pressure is above 80. Your personal target blood pressure may vary depending on your medical conditions, your age, and other factors. Follow these instructions at home: Eating and drinking   Eat a diet that is high in fiber and potassium, and low in sodium, added sugar, and fat. An example eating plan is called the  DASH (Dietary Approaches to Stop Hypertension) diet. To eat this way: ? Eat plenty of fresh fruits and vegetables. Try to fill one half of your plate at each meal with fruits and vegetables. ? Eat whole grains, such as whole-wheat pasta, brown rice, or whole-grain bread. Fill about one fourth of your plate with whole grains. ? Eat or drink low-fat dairy products, such as skim milk or low-fat yogurt. ? Avoid fatty cuts of meat, processed or cured meats, and poultry with skin. Fill about one fourth of your plate with lean proteins, such as fish, chicken without skin, beans, eggs, or tofu. ? Avoid pre-made and processed foods. These tend to be higher in sodium, added sugar, and fat.  Reduce your daily sodium intake. Most people with hypertension should eat less than 1,500 mg of sodium a day.  Do not drink alcohol if: ? Your health care provider tells you not to drink. ? You are pregnant, may be pregnant, or are planning to become pregnant.  If you drink alcohol: ? Limit how much you use to:  0-1 drink a day for women.  0-2 drinks a day for men. ? Be aware of how much alcohol is in your drink. In the [place] one drink equals one 12 oz bottle of beer (355 mL), one 5 oz glass of wine (148 mL), or one 1 oz glass of hard liquor (44 mL). Lifestyle   Work with your health care provider to maintain a healthy body weight or to lose weight. Ask what an ideal weight is for  you.  Get at least 30 minutes of exercise most days of the week. Activities may include walking, swimming, or biking.  Include exercise to strengthen your muscles (resistance exercise), such as Pilates or lifting weights, as part of your weekly exercise routine. Try to do these types of exercises for 30 minutes at least 3 days a week.  Do not use any products that contain nicotine or tobacco, such as cigarettes, e-cigarettes, and chewing tobacco. If you need help quitting, ask your health care provider.  Monitor your blood pressure  at home as told by your health care provider.  Keep all follow-up visits as told by your health care provider. This is important. Medicines  Take over-the-counter and prescription medicines only as told by your health care provider. Follow directions carefully. Blood pressure medicines must be taken as prescribed.  Do not skip doses of blood pressure medicine. Doing this puts you at risk for problems and can make the medicine less effective.  Ask your health care provider about side effects or reactions to medicines that you should watch for. Contact a health care provider if you:  Think you are having a reaction to a medicine you are taking.  Have headaches that keep coming back (recurring).  Feel dizzy.  Have swelling in your ankles.  Have trouble with your vision. Get help right away if you:  Develop a severe headache or confusion.  Have unusual weakness or numbness.  Feel faint.  Have severe pain in your chest or abdomen.  Vomit repeatedly.  Have trouble breathing. Summary  Hypertension is when the force of blood pumping through your arteries is too strong. If this condition is not controlled, it may put you at risk for serious complications.  Your personal target blood pressure may vary depending on your medical conditions, your age, and other factors. For most people, a normal blood pressure is less than 120/80.  Hypertension is treated with lifestyle changes, medicines, or a combination of both. Lifestyle changes include losing weight, eating a healthy, low-sodium diet, exercising more, and limiting alcohol. This information is not intended to replace advice given to you by your health care provider. Make sure you discuss any questions you have with your health care provider. Document Revised: 12/11/2017 Document Reviewed: 12/11/2017 Elsevier Patient Education  2020 [employer].  Health Maintenance After Age 66 After age 30, you are at a higher risk for certain  long-term diseases and infections as well as injuries from falls. Falls are a major cause of broken bones and head injuries in people who are older than age 62. Getting regular preventive care can help to keep you healthy and well. Preventive care includes getting regular testing and making lifestyle changes as recommended by your health care provider. Talk with your health care provider about:  Which screenings and tests you should have. A screening is a test that checks for a disease when you have no symptoms.  A diet and exercise plan that is right for you. What should I know about screenings and tests to prevent falls? Screening and testing are the best ways to find a health problem early. Early diagnosis and treatment give you the best chance of managing medical conditions that are common after age 58. Certain conditions and lifestyle choices may make you more likely to have a fall. Your health care provider may recommend:  Regular vision checks. Poor vision and conditions such as cataracts can make you more likely to have a fall. If you wear glasses,  make sure to get your prescription updated if your vision changes.  Medicine review. Work with your health care provider to regularly review all of the medicines you are taking, including over-the-counter medicines. Ask your health care provider about any side effects that may make you more likely to have a fall. Tell your health care provider if any medicines that you take make you feel dizzy or sleepy.  Osteoporosis screening. Osteoporosis is a condition that causes the bones to get weaker. This can make the bones weak and cause them to break more easily.  Blood pressure screening. Blood pressure changes and medicines to control blood pressure can make you feel dizzy.  Strength and balance checks. Your health care provider may recommend certain tests to check your strength and balance while standing, walking, or changing positions.  Foot health  exam. Foot pain and numbness, as well as not wearing proper footwear, can make you more likely to have a fall.  Depression screening. You may be more likely to have a fall if you have a fear of falling, feel emotionally low, or feel unable to do activities that you used to do.  Alcohol use screening. Using too much alcohol can affect your balance and may make you more likely to have a fall. What actions can I take to lower my risk of falls? General instructions  Talk with your health care provider about your risks for falling. Tell your health care provider if: ? You fall. Be sure to tell your health care provider about all falls, even ones that seem minor. ? You feel dizzy, sleepy, or off-balance.  Take over-the-counter and prescription medicines only as told by your health care provider. These include any supplements.  Eat a healthy diet and maintain a healthy weight. A healthy diet includes low-fat dairy products, low-fat (lean) meats, and fiber from whole grains, beans, and lots of fruits and vegetables. Home safety  Remove any tripping hazards, such as rugs, cords, and clutter.  Install safety equipment such as grab bars in bathrooms and safety rails on stairs.  Keep rooms and walkways well-lit. Activity   Follow a regular exercise program to stay fit. This will help you maintain your balance. Ask your health care provider what types of exercise are appropriate for you.  If you need a cane or walker, use it as recommended by your health care provider.  Wear supportive shoes that have nonskid soles. Lifestyle  Do not drink alcohol if your health care provider tells you not to drink.  If you drink alcohol, limit how much you have: ? 0-1 drink a day for women. ? 0-2 drinks a day for men.  Be aware of how much alcohol is in your drink. In the [place] one drink equals one typical bottle of beer (12 oz), one-half glass of wine (5 oz), or one shot of hard liquor (1 oz).  Do not  use any products that contain nicotine or tobacco, such as cigarettes and e-cigarettes. If you need help quitting, ask your health care provider. Summary  Having a healthy lifestyle and getting preventive care can help to protect your health and wellness after age 70.  Screening and testing are the best way to find a health problem early and help you avoid having a fall. Early diagnosis and treatment give you the best chance for managing medical conditions that are more common for people who are older than age 41.  Falls are a major cause of broken bones and head injuries  in people who are older than age 58. Take precautions to prevent a fall at home.  Work with your health care provider to learn what changes you can make to improve your health and wellness and to prevent falls. This information is not intended to replace advice given to you by your health care provider. Make sure you discuss any questions you have with your health care provider. Document Revised: 07/24/2018 Document Reviewed: 02/13/2017 Elsevier Patient Education  2020 Elsevier Inc.      Edwina Barth, MD [facility]

## 2020-02-04 ENCOUNTER — Telehealth: Payer: Self-pay | Admitting: *Deleted

## 2020-02-04 NOTE — Telephone Encounter (Signed)
Patient had an appointment 02/03/2020 and [facility] sent a message about Rx Prestalia (Perindolpri) 7-5 mg. I checked medication list in Epic noticed in history patient is no longer taking medication it was d/c at visit. Also, the Rx needed prior authorization I spoke to Raynelle Fanning and will contact the pharmacy. I did not know it was d/c until later after speaking to [place].

## 2020-02-08 ENCOUNTER — Telehealth: Payer: Self-pay | Admitting: *Deleted

## 2020-02-08 ENCOUNTER — Other Ambulatory Visit: Payer: Self-pay

## 2020-02-08 ENCOUNTER — Telehealth: Payer: Self-pay

## 2020-02-08 ENCOUNTER — Ambulatory Visit (INDEPENDENT_AMBULATORY_CARE_PROVIDER_SITE_OTHER): Payer: Managed Care, Other (non HMO) | Admitting: Emergency Medicine

## 2020-02-08 ENCOUNTER — Telehealth: Payer: Self-pay | Admitting: Emergency Medicine

## 2020-02-08 DIAGNOSIS — E785 Hyperlipidemia, unspecified: Secondary | ICD-10-CM

## 2020-02-08 DIAGNOSIS — N400 Enlarged prostate without lower urinary tract symptoms: Secondary | ICD-10-CM

## 2020-02-08 DIAGNOSIS — Z131 Encounter for screening for diabetes mellitus: Secondary | ICD-10-CM

## 2020-02-08 DIAGNOSIS — I10 Essential (primary) hypertension: Secondary | ICD-10-CM

## 2020-02-08 MED ORDER — ROSUVASTATIN CALCIUM 20 MG PO TABS: 20.0000 mg | ORAL_TABLET | Freq: Every day | ORAL | 1 refills | Status: AC

## 2020-02-08 NOTE — Telephone Encounter (Signed)
Received a fax after hours regarding pt states he needs his medication called in. Reports that Rx was called in incorrectly. Rx is Amlodipine looks like it has already been handled from previous encounters put in today 02/05/20. Please advise.

## 2020-02-08 NOTE — Telephone Encounter (Signed)
I spoke with the patient advised this medication isn't covered.  He states he is ok with two separate medications being called into the pharmacy for him.

## 2020-02-08 NOTE — Telephone Encounter (Signed)
Spoke with [place]  medication is not covered, he will call in alternatives. The patient was advised and voiced understanding

## 2020-02-08 NOTE — Telephone Encounter (Signed)
Thanks

## 2020-02-08 NOTE — Telephone Encounter (Signed)
No additional notes required  

## 2020-02-09 ENCOUNTER — Other Ambulatory Visit: Payer: Self-pay | Admitting: Emergency Medicine

## 2020-02-09 ENCOUNTER — Telehealth: Payer: Self-pay | Admitting: Emergency Medicine

## 2020-02-09 ENCOUNTER — Telehealth: Payer: Self-pay

## 2020-02-09 DIAGNOSIS — I10 Essential (primary) hypertension: Secondary | ICD-10-CM

## 2020-02-09 LAB — CMP14+EGFR
ALT: 16 IU/L (ref 0–44)
AST: 15 IU/L (ref 0–40)
Albumin/Globulin Ratio: 1.8 (ref 1.2–2.2)
Albumin: 4.7 g/dL (ref 3.8–4.8)
Alkaline Phosphatase: 75 IU/L (ref 44–121)
BUN/Creatinine Ratio: 18 (ref 10–24)
BUN: 17 mg/dL (ref 8–27)
Bilirubin Total: 0.7 mg/dL (ref 0.0–1.2)
CO2: 21 mmol/L (ref 20–29)
Calcium: 9.7 mg/dL (ref 8.6–10.2)
Chloride: 103 mmol/L (ref 96–106)
Creatinine, Ser: 0.95 mg/dL (ref 0.76–1.27)
GFR calc Af Amer: 97 mL/min/{1.73_m2} (ref >59)
GFR calc non Af Amer: 84 mL/min/{1.73_m2} (ref >59)
Globulin, Total: 2.6 g/dL (ref 1.5–4.5)
Glucose: 103 mg/dL — ABNORMAL HIGH (ref 65–99)
Potassium: 4.5 mmol/L (ref 3.5–5.2)
Sodium: 140 mmol/L (ref 134–144)
Total Protein: 7.3 g/dL (ref 6.0–8.5)

## 2020-02-09 LAB — LIPID PANEL
Chol/HDL Ratio: 2.8 ratio (ref 0.0–5.0)
Cholesterol, Total: 145 mg/dL (ref 100–199)
HDL: 52 mg/dL (ref >39)
LDL Chol Calc (NIH): 77 mg/dL (ref 0–99)
Triglycerides: 86 mg/dL (ref 0–149)
VLDL Cholesterol Cal: 16 mg/dL (ref 5–40)

## 2020-02-09 LAB — PSA: Prostate Specific Ag, Serum: 1 ng/mL (ref 0.0–4.0)

## 2020-02-09 LAB — HEMOGLOBIN A1C
Est. average glucose Bld gHb Est-mCnc: 128 mg/dL
Hgb A1c MFr Bld: 6.1 % — ABNORMAL HIGH (ref 4.8–5.6)

## 2020-02-09 MED ORDER — LOSARTAN POTASSIUM 50 MG PO TABS: 50.0000 mg | ORAL_TABLET | Freq: Every day | ORAL | 3 refills | Status: DC

## 2020-02-09 NOTE — Telephone Encounter (Signed)
Pt is stating [facility] on [address] did not receive his prescription request on 02/08/20 for this script. Please resend.

## 2020-02-09 NOTE — Telephone Encounter (Signed)
Thank you will inform pt

## 2020-02-09 NOTE — Telephone Encounter (Signed)
Pt came by and a good alternative per his insurance company would be  prestalia 7mg /5 mg amlodapine   Pt needs it tonight

## 2020-02-09 NOTE — Telephone Encounter (Signed)
Spoke with Andrew Murray at [facility] about sending a referral so Andrew Murray could be scheduled for a new patient appt. Said she would send a referral right over.Marland KitchenMarland Kitchen

## 2020-02-09 NOTE — Telephone Encounter (Signed)
Yes

## 2020-02-09 NOTE — Telephone Encounter (Signed)
Will send script for losartan 50 mg daily.

## 2020-02-09 NOTE — Telephone Encounter (Signed)
Pt showed up at [facility] today 02/09/20 wanting to be seen for a NP app. Morrie Sheldon called from that office requesting a referral, so they can make an appointment for him.

## 2020-02-09 NOTE — Telephone Encounter (Signed)
Pt medication perindopril is not avalible here in any dose per [facility] pt will need an alternative please prescribe pt stating he is out of his medication and still needs his evening dose.

## 2020-02-09 NOTE — Telephone Encounter (Signed)
Patient walked into our office today asking that we refills his BP medication. There are not records where we have ever seen him before but he says he saw one of our providers over 10 years ago. Patient states he needs a refill on his Perindopril aginie/amlodipine 10mg -5mg .    I called patient to express need for new patient appointment before we can send in refills but there was not voice mail setup.

## 2020-02-09 NOTE — Telephone Encounter (Signed)
Referral placed and will be processed to [facility]

## 2020-02-09 NOTE — Telephone Encounter (Signed)
Pt showed up at cards office asking to be seen pt will need referral to do so, is this an appropriate referral??

## 2020-02-14 NOTE — Progress Notes (Signed)
Cardiology Office Note:   Date:  02/15/2020  NAME:  Andrew Murray    MRN: [id number] DOB:  [birth year]   PCP:  Georgina Quint, MD  Cardiologist:  No primary care provider on file.   Referring MD: Georgina Quint, *   Chief Complaint  Patient presents with  . Abnormal ECG   History of Present Illness:   Andrew Murray is a 64 y.o. male with a hx of asthma, HTN who is being seen today for the evaluation of abnormal EKG at the request of Sagardia, Eilleen Kempf, MD. Has HTN and needs further evaluation per PCP.  He reports he was on an ACE inhibitor and amlodipine in quite.  He apparently travels back and forth to work.  He owns 2 businesses in shipping.  He apparently was seeing a doctor there who was prescribing that medication.  When he comes back to [place] he is taking losartan and bisoprolol.  He reports he would like clarification on what he should be taking.  His blood pressure is 129/73.  He is on losartan 50 mg daily and bisoprolol 5 mg twice daily.  He describes no chest pain or shortness of breath.  He reports he exercises every other day.  This includes walk on a treadmill as well as lifting weights.  He is obese with a BMI of 34.  He is working on losing weight.  He describes no chest pain or pressure when he exerts himself.  No shortness of breath.  He is prediabetic and working on his diet.  He reports he had a heart catheterization 10 years ago in [place].  He reports he was told everything looked okay.  He is remained on aspirin and a statin since that time.  He reports he does have the records and will bring those to our next visit  Problem List 1. HTN 2. HLD -T chol 145, HDL 52, LDL 77, TG 86  3. Pre-DM -A1c 6.1  Past Medical History: Past Medical History:  Diagnosis Date  . Asthma   . Hypertension   . Seasonal allergies     Past Surgical History: Past Surgical History:  Procedure Laterality Date  . HERNIA REPAIR      Current  Medications: Current Meds  Medication Sig  . aspirin EC 81 MG tablet Take 81 mg by mouth daily.  . Bacillus Coagulans-Inulin (PROBIOTIC-PREBIOTIC PO) Take by mouth.  . bisoprolol (ZEBETA) 5 MG tablet Take 1 tablet (5 mg total) by mouth daily.  . cetirizine (ZYRTEC) 10 MG tablet Take 1 tablet (10 mg total) by mouth daily.  Marland Kitchen losartan (COZAAR) 50 MG tablet Take 1 tablet (50 mg total) by mouth daily.  . rosuvastatin (CRESTOR) 20 MG tablet Take 1 tablet (20 mg total) by mouth daily.  . [DISCONTINUED] bisoprolol (ZEBETA) 5 MG tablet Take 1 tablet (5 mg total) by mouth daily.  . [DISCONTINUED] losartan (COZAAR) 50 MG tablet Take 1 tablet (50 mg total) by mouth daily.     Allergies:    Sulfonamide derivatives   Social History: Social History   Socioeconomic History  . Marital status: Married    Spouse name: Not on file  . Number of children: 2  . Years of education: Not on file  . Highest education level: Not on file  Occupational History  . Not on file  Tobacco Use  . Smoking status: Never Smoker  . Smokeless tobacco: Never Used  Substance and Sexual Activity  . Alcohol use:  Never  . Drug use: Never  . Sexual activity: Not on file  Other Topics Concern  . Not on file  Social History Narrative  . Not on file   Social Determinants of Health   Financial Resource Strain:   . Difficulty of Paying Living Expenses: Not on file  Food Insecurity:   . Worried About Programme researcher, broadcasting/film/video in the Last Year: Not on file  . Ran Out of Food in the Last Year: Not on file  Transportation Needs:   . Lack of Transportation (Medical): Not on file  . Lack of Transportation (Non-Medical): Not on file  Physical Activity:   . Days of Exercise per Week: Not on file  . Minutes of Exercise per Session: Not on file  Stress:   . Feeling of Stress : Not on file  Social Connections:   . Frequency of Communication with Friends and Family: Not on file  . Frequency of Social Gatherings with Friends and  Family: Not on file  . Attends Religious Services: Not on file  . Active Member of Clubs or Organizations: Not on file  . Attends Banker Meetings: Not on file  . Marital Status: Not on file     Family History: The patient's family history includes Diabetes in his father and mother; Heart disease in his father; High Cholesterol in his father.  ROS:   All other ROS reviewed and negative. Pertinent positives noted in the HPI.     EKGs/Labs/Other Studies Reviewed:   The following studies were personally reviewed by me today:  EKG:  EKG is ordered today.  The ekg ordered today demonstrates NSR 63 bpm, no acute STT or evidence of prior infarction, and was personally reviewed by me.   Recent Labs: 02/08/2020: ALT 16; BUN 17; Creatinine, Ser 0.95; Potassium 4.5; Sodium 140   Recent Lipid Panel    Component Value Date/Time   CHOL 145 02/08/2020 1004   TRIG 86 02/08/2020 1004   HDL 52 02/08/2020 1004   CHOLHDL 2.8 02/08/2020 1004   LDLCALC 77 02/08/2020 1004    Physical Exam:   VS:  BP 129/73   Pulse 63   Ht 5\' 8"  (1.727 m)   Wt 228 lb 6.4 oz (103.6 kg)   BMI 34.73 kg/m    Wt Readings from Last 3 Encounters:  02/15/20 228 lb 6.4 oz (103.6 kg)  02/03/20 226 lb (102.5 kg)  11/12/17 225 lb (102.1 kg)    General: Well nourished, well developed, in no acute distress Heart: Atraumatic, normal size  Eyes: PEERLA, EOMI  Neck: Supple, no JVD Endocrine: No thryomegaly Cardiac: Normal S1, S2; RRR; no murmurs, rubs, or gallops Lungs: Clear to auscultation bilaterally, no wheezing, rhonchi or rales  Abd: Soft, nontender, no hepatomegaly  Ext: No edema, pulses 2+ Musculoskeletal: No deformities, BUE and BLE strength normal and equal Skin: Warm and dry, no rashes   Neuro: Alert and oriented to person, place, time, and situation, CNII-XII grossly intact, no focal deficits  Psych: Normal mood and affect   ASSESSMENT:   Andrew Murray is a 64 y.o. male who presents  for the following: 1. Nonspecific abnormal electrocardiogram (ECG) (EKG)   2. Primary hypertension   3. Essential hypertension     PLAN:   1. Nonspecific abnormal electrocardiogram (ECG) (EKG) 2. Primary hypertension 3. Essential hypertension -He reports he was taken an ACE inhibitor and amlodipine and Coreg.  He is taking losartan and bisoprolol here.  I have recommended  he take losartan 50 mg daily and bisoprolol 5 mg twice daily moving forward.  We will give him prescriptions for 90-day supply.  He will then be able to use this while he is in [place].  I think he will be able to find similar medications to the sink weight.  I cannot recommend he take an ACE inhibitor or an ARB at the same time.  He is in agreement with this.  His EKG is normal.  Apparently he had heart catheterization in [place] about 10 years ago.  I would like for him to bring the results of that test.  He reports he was told everything was okay.  He has no symptoms of angina.  He is prediabetic.  He should remain on aspirin and a statin.  His most recent LDL cholesterol was 77.  If he has nonobstructive CAD we will likely further titrate to an LDL goal less than 70.  Disposition: Return in about 1 year (around 02/14/2021).  Medication Adjustments/Labs and Tests Ordered: Current medicines are reviewed at length with the patient today.  Concerns regarding medicines are outlined above.  Orders Placed This Encounter  Procedures  . EKG 12-Lead   Meds ordered this encounter  Medications  . bisoprolol (ZEBETA) 5 MG tablet    Sig: Take 1 tablet (5 mg total) by mouth daily.    Dispense:  90 tablet    Refill:  3  . losartan (COZAAR) 50 MG tablet    Sig: Take 1 tablet (50 mg total) by mouth daily.    Dispense:  90 tablet    Refill:  3    Patient Instructions  Medication Instructions:  Continue same medications *If you need a refill on your cardiac medications before your next appointment, please call your  pharmacy*   Lab Work: None ordered   Testing/Procedures: None ordered   Follow-Up: At [facility], you and your health needs are our priority.  As part of our continuing mission to provide you with exceptional heart care, we have created designated Provider Care Teams.  These Care Teams include your primary Cardiologist (physician) and Advanced Practice Providers (APPs -  Physician Assistants and Nurse Practitioners) who all work together to provide you with the care you need, when you need it.  We recommend signing up for the patient portal called "MyChart".  Sign up information is provided on this After Visit Summary.  MyChart is used to connect with patients for Virtual Visits (Telemedicine).  Patients are able to view lab/test results, encounter notes, upcoming appointments, etc.  Non-urgent messages can be sent to your provider as well.   To learn more about what you can do with MyChart, go to [email or web address].    Your next appointment:  1 year    Call in August to schedule a Nov appointment    The format for your next appointment: Office    Provider:  Dr.O'Neal      Signed, Gerri Spore T. Flora Lipps, MD [facility]  [address], [address] [place], [place] [address] [phone]  02/15/2020 4:44 PM

## 2020-02-15 ENCOUNTER — Encounter: Payer: Self-pay | Admitting: Cardiovascular Disease

## 2020-02-15 ENCOUNTER — Other Ambulatory Visit: Payer: Self-pay

## 2020-02-15 ENCOUNTER — Ambulatory Visit (INDEPENDENT_AMBULATORY_CARE_PROVIDER_SITE_OTHER): Payer: Managed Care, Other (non HMO) | Admitting: Cardiovascular Disease

## 2020-02-15 VITALS — BP 129/73 | HR 63 | Ht 68.0 in | Wt 228.4 lb

## 2020-02-15 DIAGNOSIS — I10 Essential (primary) hypertension: Secondary | ICD-10-CM

## 2020-02-15 DIAGNOSIS — R9431 Abnormal electrocardiogram [ECG] [EKG]: Secondary | ICD-10-CM | POA: Diagnosis not present

## 2020-02-15 MED ORDER — BISOPROLOL FUMARATE 5 MG PO TABS: 5.0000 mg | ORAL_TABLET | Freq: Every day | ORAL | 3 refills | Status: DC

## 2020-02-15 MED ORDER — LOSARTAN POTASSIUM 50 MG PO TABS: 50.0000 mg | ORAL_TABLET | Freq: Every day | ORAL | 3 refills | Status: AC

## 2020-02-15 NOTE — Patient Instructions (Signed)
Medication Instructions:  Continue same medications *If you need a refill on your cardiac medications before your next appointment, please call your pharmacy*   Lab Work: None ordered   Testing/Procedures: None ordered   Follow-Up: At [facility], you and your health needs are our priority.  As part of our continuing mission to provide you with exceptional heart care, we have created designated Provider Care Teams.  These Care Teams include your primary Cardiologist (physician) and Advanced Practice Providers (APPs -  Physician Assistants and Nurse Practitioners) who all work together to provide you with the care you need, when you need it.  We recommend signing up for the patient portal called "MyChart".  Sign up information is provided on this After Visit Summary.  MyChart is used to connect with patients for Virtual Visits (Telemedicine).  Patients are able to view lab/test results, encounter notes, upcoming appointments, etc.  Non-urgent messages can be sent to your provider as well.   To learn more about what you can do with MyChart, go to [email or web address].    Your next appointment:  1 year    Call in August to schedule a Nov appointment    The format for your next appointment: Office    Provider:  Dr.O'Neal

## 2020-02-18 ENCOUNTER — Encounter: Payer: Self-pay | Admitting: Family Medicine

## 2020-02-18 ENCOUNTER — Other Ambulatory Visit: Payer: Self-pay

## 2020-02-18 ENCOUNTER — Ambulatory Visit (INDEPENDENT_AMBULATORY_CARE_PROVIDER_SITE_OTHER): Payer: Managed Care, Other (non HMO) | Admitting: Family Medicine

## 2020-02-18 VITALS — BP 135/83 | HR 65 | Temp 97.6°F | Ht 68.0 in | Wt 228.0 lb

## 2020-02-18 DIAGNOSIS — I10 Essential (primary) hypertension: Secondary | ICD-10-CM | POA: Diagnosis not present

## 2020-02-18 DIAGNOSIS — Z0001 Encounter for general adult medical examination with abnormal findings: Secondary | ICD-10-CM

## 2020-02-18 DIAGNOSIS — E785 Hyperlipidemia, unspecified: Secondary | ICD-10-CM

## 2020-02-18 DIAGNOSIS — R7303 Prediabetes: Secondary | ICD-10-CM

## 2020-02-18 DIAGNOSIS — Z0000 Encounter for general adult medical examination without abnormal findings: Secondary | ICD-10-CM

## 2020-02-18 DIAGNOSIS — Z1211 Encounter for screening for malignant neoplasm of colon: Secondary | ICD-10-CM | POA: Diagnosis not present

## 2020-02-18 DIAGNOSIS — Z8679 Personal history of other diseases of the circulatory system: Secondary | ICD-10-CM

## 2020-02-18 NOTE — Patient Instructions (Addendum)
You have prediabetes.  It is very important that you lose weight and continue to eat less and exercise more.  If this continues to get worse over the coming months we may need to add a diabetic medicine called Metformin to your regimen.  I recommend that you read up on diabetes at the American diabetes Association website which is diabetes.org  It is important that you get another: Test done.  I think the best option for you since you cannot tolerate the colonoscopy prep, would be to take a Cologuard test.  This checks for DNA of tumor cells and is almost as accurate as the colonoscopy nowadays.  However if it comes back positive you still need a colonoscopy.  Continue your current blood pressure medicine and cardiology care.  I would recommend you take your aspirin every day.  It has a very mild blood thinning effect but can help prevent heart attacks and strokes in somebody who has coronary artery disease like you have.  Continue taking the Crestor.  It is controlling you well, but it is very important that you keep your cholesterol down.  Work hard on losing weight also to try and help your snoring.  If the snoring continues to persist and if you have episodes of sleep apnea (stopping breathing when you are snoring) should have a sleep study done.  We can arrange that for you when you get back from [place] or you could get it over there if necessary.  Return to see Dr. Alvy Bimler in about 6 months to follow-up on the blood sugar testing and other items.      If you have lab work done today you will be contacted with your lab results within the next 2 weeks.  If you have not heard from [place] then please contact us. The fastest way to get your results is to register for My Chart.   IF you received an x-ray today, you will receive an invoice from [facility]. Please contact [facility] at [phone] with questions or concerns regarding your invoice.   IF you received labwork  today, you will receive an invoice from [facility]. Please contact [facility] at [phone] with questions or concerns regarding your invoice.   Our billing staff will not be able to assist you with questions regarding bills from these companies.  You will be contacted with the lab results as soon as they are available. The fastest way to get your results is to activate your My Chart account. Instructions are located on the last page of this paperwork. If you have not heard from [place] regarding the results in 2 weeks, please contact this office.

## 2020-02-18 NOTE — Progress Notes (Signed)
Patient ID: Andrew Murray, male    DOB: 21-Jul-1955  Age: 64 y.o. MRN: [id number]  Chief Complaint  Patient presents with  . Annual Exam  . discuss lab results    would like to address a1c and diabetes    Subjective:   Patient is here for his annual physical examination.  No major complaints.  He does have some headaches.  He gets some of his care here and some in [place].  Past medical history: Medications: 81 mg aspirin Bisoprolol 5 mg daily Zyrtec 10 daily Losartan 50 mg daily Rosuvastatin 20 mg daily Was on a different blood pressure which was a combination pill from [place] similar to Lotrel.  He is already been switched by someone else to the losartan and it is controlling the blood pressure well also he does not need to be back on it.  If he ever does go back on it he should take it and not the losartan. Allergies: Sulfa Past medical history: Allergies and mild asthma, not a major issue Surgeries: 10 years ago he had a couple of stents placed with a couple of catheterizations.  He has done well.  He has been seen by cardiologist in the [place] and in [place].  Social history: Mother is living and 39 years old.  She is diabetic and hypertensive.  Father is deceased at 78 with diabetes heart disease and hypertension.  No major familial diseases other than these.  Social history: [place]  [place] and [place].  His wife is a PhD Professor of social work in [place] but also currently is [place] in the states on sabbatical.  He has 2 children here in the [place] who I believe both still live at home.  The other one just graduated from high school and is in college.  The older 1 is working.  They are Islamic, though have not gone to the mosque much in the last couple of years due to Covid.  He has a good marriage.  He exercises regularly.  He knows his weight is a problem.  Constitutional: Unremarkable HEENT: Has no complaints.  Wears glasses at times. Respiratory:  Unremarkable Cardiovascular: Unremarkable GI: Has some constipation problems, otherwise unremarkable.  He is due a colonoscopy, having had one about 15 years ago.  He says he cannot tolerate the laxative prep.  We talked about that and recommended a Cologuard. Endocrine: Unremarkable Genitourinary: Unremarkable Musculoskeletal: Unremarkable Dermatologic: Unremarkable Allergy/immunology: Unremarkable Neurologic: Unremarkable Hematologic: Unremarkable Psychiatric: Unremarkable   Current allergies, medications, problem list, past/family and social histories reviewed.  Objective:  BP 135/83   Pulse 65   Temp 97.6 F (36.4 C)   Ht 5\' 8"  (1.727 m)   Wt 228 lb (103.4 kg)   SpO2 100%   BMI 34.67 kg/m   Overweight male alert and oriented pleasant, in no acute distress.  TMs normal.  Eyes PERRL.  EOMs intact.  Throat clear.  Neck supple without nodes or thyromegaly.  No carotid bruits.  Chest is clear to auscultation.  Heart regular without murmur.  Abdomen soft without mass or tenderness.  Normal male external genitalia with testes centered.  No hernias.  Extremities unremarkable.  Skin normal.  Assessment & Plan:   Assessment: 1. Physical exam, annual   2. Special screening for malignant neoplasms, colon   3. Essential hypertension   4. Dyslipidemia   5. History of coronary artery disease   6. Prediabetes       Plan: Reviewed labs with  him.  Talk to him about the prediabetes.  Also talked to his wife about their diet.  See him back in about 6 months and check A1c at that time.  Dr. Alvy Bimler is his regular doctor.  Orders Placed This Encounter  Procedures  . Cologuard    No orders of the defined types were placed in this encounter.        Patient Instructions   You have prediabetes.  It is very important that you lose weight and continue to eat less and exercise more.  If this continues to get worse over the coming months we may need to add a diabetic medicine called  Metformin to your regimen.  I recommend that you read up on diabetes at the American diabetes Association website which is diabetes.org  It is important that you get another: Test done.  I think the best option for you since you cannot tolerate the colonoscopy prep, would be to take a Cologuard test.  This checks for DNA of tumor cells and is almost as accurate as the colonoscopy nowadays.  However if it comes back positive you still need a colonoscopy.  Continue your current blood pressure medicine and cardiology care.  I would recommend you take your aspirin every day.  It has a very mild blood thinning effect but can help prevent heart attacks and strokes in somebody who has coronary artery disease like you have.  Continue taking the Crestor.  It is controlling you well, but it is very important that you keep your cholesterol down.  Work hard on losing weight also to try and help your snoring.  If the snoring continues to persist and if you have episodes of sleep apnea (stopping breathing when you are snoring) should have a sleep study done.  We can arrange that for you when you get back from [place] or you could get it over there if necessary.  Return to see Dr. Alvy Bimler in about 6 months to follow-up on the blood sugar testing and other items.      If you have lab work done today you will be contacted with your lab results within the next 2 weeks.  If you have not heard from [place] then please contact us. The fastest way to get your results is to register for My Chart.   IF you received an x-ray today, you will receive an invoice from [facility]. Please contact [facility] at [phone] with questions or concerns regarding your invoice.   IF you received labwork today, you will receive an invoice from [facility]. Please contact [facility] at [phone] with questions or concerns regarding your invoice.   Our billing staff will not be able to assist you with questions  regarding bills from these companies.  You will be contacted with the lab results as soon as they are available. The fastest way to get your results is to activate your My Chart account. Instructions are located on the last page of this paperwork. If you have not heard from [place] regarding the results in 2 weeks, please contact this office.         Return in about 6 months (around 08/17/2020), or if symptoms worsen or fail to improve, for Sagardia.   Janace Hoard, MD 02/18/2020

## 2020-02-19 ENCOUNTER — Telehealth: Payer: Self-pay | Admitting: Emergency Medicine

## 2020-02-19 DIAGNOSIS — R635 Abnormal weight gain: Secondary | ICD-10-CM

## 2020-02-19 DIAGNOSIS — R634 Abnormal weight loss: Secondary | ICD-10-CM

## 2020-02-19 NOTE — Telephone Encounter (Signed)
Received a fax after hours and stated that pt just lef tour office from her cpe appt with Dr. Alwyn Ren and he needs to see a nutritionists specialist for him and his wife and he needs to see this specialist before he travels out of the country. Please advise.

## 2020-02-19 NOTE — Telephone Encounter (Signed)
Pt is checking on status of this message, he is wanting a cb concerning if he can get a nutritionist. Please advise at [phone].

## 2020-02-22 NOTE — Telephone Encounter (Signed)
Pt is calling again about the referral for the nutritionist from when pt saw Dr. Alwyn Ren. Please advise.

## 2020-02-23 NOTE — Telephone Encounter (Signed)
Pt would like to see nutritionist, please advise if appropriate or place referral so we may begin processing

## 2020-02-25 NOTE — Telephone Encounter (Signed)
Pt is leaving out of the country this coming Monday. He would like to get this process going. This is a Sagardia pt.

## 2020-02-26 NOTE — Telephone Encounter (Signed)
Called pt and informed him that we can go ahead and do a referral for the weight loss since he was seen recently by Dr. Alwyn Ren.   Also documented in wife's chart about her referral.

## 2020-02-26 NOTE — Telephone Encounter (Signed)
Pt came in. Told us he wants his wife to be called for the referral to the nutritionist. He will be out of the country for 2 months. Her name is Loreta Ave, [phone]. Please advise.

## 2020-03-08 LAB — COLOGUARD: Cologuard: NEGATIVE

## 2020-03-15 ENCOUNTER — Encounter: Payer: Self-pay | Admitting: Radiology

## 2020-05-09 ENCOUNTER — Ambulatory Visit: Payer: Managed Care, Other (non HMO) | Admitting: Dietician

## 2020-08-22 ENCOUNTER — Ambulatory Visit: Payer: Self-pay | Admitting: Emergency Medicine

## 2020-11-24 ENCOUNTER — Ambulatory Visit (INDEPENDENT_AMBULATORY_CARE_PROVIDER_SITE_OTHER): Payer: Managed Care, Other (non HMO) | Admitting: Emergency Medicine

## 2020-11-24 ENCOUNTER — Other Ambulatory Visit: Payer: Self-pay

## 2020-11-24 ENCOUNTER — Encounter (INDEPENDENT_AMBULATORY_CARE_PROVIDER_SITE_OTHER): Payer: Self-pay

## 2020-11-24 ENCOUNTER — Encounter: Payer: Self-pay | Admitting: Emergency Medicine

## 2020-11-24 VITALS — BP 128/86 | HR 70 | Temp 98.3°F | Ht 68.0 in | Wt 206.0 lb

## 2020-11-24 DIAGNOSIS — R7303 Prediabetes: Secondary | ICD-10-CM

## 2020-11-24 DIAGNOSIS — G8929 Other chronic pain: Secondary | ICD-10-CM

## 2020-11-24 DIAGNOSIS — H547 Unspecified visual loss: Secondary | ICD-10-CM | POA: Diagnosis not present

## 2020-11-24 DIAGNOSIS — R1030 Lower abdominal pain, unspecified: Secondary | ICD-10-CM

## 2020-11-24 DIAGNOSIS — I10 Essential (primary) hypertension: Secondary | ICD-10-CM

## 2020-11-24 DIAGNOSIS — E785 Hyperlipidemia, unspecified: Secondary | ICD-10-CM | POA: Diagnosis not present

## 2020-11-24 DIAGNOSIS — Z23 Encounter for immunization: Secondary | ICD-10-CM

## 2020-11-24 DIAGNOSIS — M25512 Pain in left shoulder: Secondary | ICD-10-CM

## 2020-11-24 DIAGNOSIS — M79651 Pain in right thigh: Secondary | ICD-10-CM

## 2020-11-24 LAB — COMPREHENSIVE METABOLIC PANEL
ALT: 13 U/L (ref 0–53)
AST: 15 U/L (ref 0–37)
Albumin: 4.5 g/dL (ref 3.5–5.2)
Alkaline Phosphatase: 64 U/L (ref 39–117)
BUN: 13 mg/dL (ref 6–23)
CO2: 27 mEq/L (ref 19–32)
Calcium: 9.2 mg/dL (ref 8.4–10.5)
Chloride: 105 mEq/L (ref 96–112)
Creatinine, Ser: 0.9 mg/dL (ref 0.40–1.50)
GFR: 89.13 mL/min (ref >60.00)
Glucose, Bld: 89 mg/dL (ref 70–99)
Potassium: 4.1 mEq/L (ref 3.5–5.1)
Sodium: 139 mEq/L (ref 135–145)
Total Bilirubin: 0.6 mg/dL (ref 0.2–1.2)
Total Protein: 7.4 g/dL (ref 6.0–8.3)

## 2020-11-24 LAB — LIPID PANEL
Cholesterol: 116 mg/dL (ref 0–200)
HDL: 47.4 mg/dL (ref >39.00)
LDL Cholesterol: 55 mg/dL (ref 0–99)
NonHDL: 68.5
Total CHOL/HDL Ratio: 2
Triglycerides: 67 mg/dL (ref 0.0–149.0)
VLDL: 13.4 mg/dL (ref 0.0–40.0)

## 2020-11-24 LAB — HEMOGLOBIN A1C: Hgb A1c MFr Bld: 6 % (ref 4.6–6.5)

## 2020-11-24 MED ORDER — DICYCLOMINE HCL 10 MG PO CAPS: 10.0000 mg | ORAL_CAPSULE | Freq: Three times a day (TID) | ORAL | 0 refills | Status: AC | PRN

## 2020-11-24 NOTE — Assessment & Plan Note (Signed)
Well-controlled hypertension.  Continue losartan 50 mg daily. Dietary approach to stop hypertension discussed.

## 2020-11-24 NOTE — Assessment & Plan Note (Signed)
Acute onset.  No red flag signs or symptoms.  Most likely benign gastroenteritis picture.  Intestinal spasms may benefit from Bentyl.  Advised to stay on BRAT diet for the next 48 hours.

## 2020-11-24 NOTE — Assessment & Plan Note (Signed)
May benefit from physical therapy.  Will refer to sports medicine.

## 2020-11-24 NOTE — Assessment & Plan Note (Signed)
Diet and nutrition discussed.  Continue rosuvastatin 20 mg daily. The 10-year ASCVD risk score Denman George DC Montez Hageman., et al., 2013) is: 12.7%   Values used to calculate the score:     Age: 65 years     Sex: Male     Is Non-Hispanic African American: No     Diabetic: No     Tobacco smoker: No     Systolic Blood Pressure: 128 mmHg     Is BP treated: Yes     HDL Cholesterol: 52 mg/dL     Total Cholesterol: 145 mg/dL

## 2020-11-24 NOTE — Assessment & Plan Note (Signed)
Diet and nutrition discussed.  Advised to decrease amount of daily carbohydrate intake. 

## 2020-11-24 NOTE — Assessment & Plan Note (Signed)
Uncertain diagnosis for his symptoms.  Needs ophthalmology evaluation.  Referral placed today.

## 2020-11-24 NOTE — Patient Instructions (Signed)

## 2020-11-24 NOTE — Assessment & Plan Note (Signed)
Musculoskeletal in nature.  May benefit from physical therapy.  Will refer to sports medicine.

## 2020-11-24 NOTE — Progress Notes (Signed)
Mayo Faulk Lavergne 65 y.o.   Chief Complaint  Patient presents with   Hypertension    HISTORY OF PRESENT ILLNESS: This is a 66 y.o. male with history of hypertension here for follow-up. Last office visit 02/08/2020. Presently on losartan 50 mg daily.  Eating better.  Losing weight. BP Readings from Last 3 Encounters:  11/24/20 138/86  02/18/20 135/83  02/15/20 129/73  Also has the following complaints today: 1.  3 weeks ago developed what sounds like visual halos/scotomas.  Was in [place] at the time.  Went to see optometrist who sent him to ophthalmologist.  Was examined and prescribed eyedrops which are anti-inflammatory and antibiotic.  Advised to follow-up with ophthalmologist [place]. 2.  Chronic pain to left shoulder worse with movement and intermittent pain to back of right thigh.  Needs referral to sports medicine. 3.  Intermittent crampy lower abdominal pain that started yesterday with occasional diarrhea.  No fever or chills.  Denies nausea or vomiting.  Denies rectal bleeding.  Thinks it is something he ate. No other complaints or medical concerns today.  Hypertension Pertinent negatives include no chest pain, headaches, palpitations or shortness of breath.    Prior to Admission medications   Medication Sig Start Date End Date Taking? Authorizing Provider  aspirin EC 81 MG tablet Take 81 mg by mouth daily.   Yes [provider]  Bacillus Coagulans-Inulin (PROBIOTIC-PREBIOTIC PO) Take by mouth.   Yes [provider]  cetirizine (ZYRTEC) 10 MG tablet Take 1 tablet (10 mg total) by mouth daily. 02/03/20  Yes Zian Mohamed, Eilleen Kempf, MD  losartan (COZAAR) 50 MG tablet Take 1 tablet (50 mg total) by mouth daily. 02/15/20  Yes O'Neal, Ronnald Ramp, MD  rosuvastatin (CRESTOR) 20 MG tablet Take 1 tablet (20 mg total) by mouth daily. 02/08/20  Yes Brynlei Klausner, Eilleen Kempf, MD  bisoprolol (ZEBETA) 5 MG tablet Take 1 tablet (5 mg total) by  mouth daily. 02/15/20 05/15/20  O'NealRonnald Ramp, MD    Allergies  Allergen Reactions   Sulfonamide Derivatives     REACTION: rash    Patient Active Problem List   Diagnosis Date Noted   Lower urinary tract symptoms (LUTS) 11/01/2017   Prostate enlargement 11/01/2017   Essential hypertension 09/20/2017   Dyslipidemia 09/20/2017   SNORING 11/16/2008    Past Medical History:  Diagnosis Date   Allergy    Asthma    Hyperlipidemia    Hypertension    Seasonal allergies     Past Surgical History:  Procedure Laterality Date   CARDIAC VALVE SURGERY     10 yrs ago   HERNIA REPAIR      Social History   Socioeconomic History   Marital status: Married    Spouse name: Not on file   Number of children: 2   Years of education: Not on file   Highest education level: Not on file  Occupational History   Not on file  Tobacco Use   Smoking status: Never   Smokeless tobacco: Never  Substance and Sexual Activity   Alcohol use: Never   Drug use: Never   Sexual activity: Not on file  Other Topics Concern   Not on file  Social History Narrative   Not on file   Social Determinants of Health   Financial Resource Strain: Not on file  Food Insecurity: Not on file  Transportation Needs: Not on file  Physical Activity: Not on file  Stress: Not on file  Social Connections: Not on file  Intimate Partner Violence: Not on file    Family History  Problem Relation Age of Onset   Diabetes Mother    Hypertension Mother    Diabetes Father    Heart disease Father    High Cholesterol Father    Hypertension Father      Review of Systems  Constitutional: Negative.  Negative for chills and fever.  HENT: Negative.  Negative for congestion and sore throat.   Eyes:  Positive for photophobia and pain.  Respiratory: Negative.  Negative for cough and shortness of breath.   Cardiovascular: Negative.  Negative for chest pain and palpitations.  Gastrointestinal:  Positive for  abdominal pain and diarrhea. Negative for blood in stool, melena, nausea and vomiting.  Genitourinary: Negative.   Musculoskeletal:  Positive for back pain and joint pain.  Skin: Negative.  Negative for rash.  Neurological:  Negative for dizziness and headaches.  All other systems reviewed and are negative.  Vitals:   11/24/20 0950  BP: 138/86  Pulse: 70  Temp: 98.3 F (36.8 C)  SpO2: 98%   Wt Readings from Last 3 Encounters:  11/24/20 206 lb (93.4 kg)  02/18/20 228 lb (103.4 kg)  02/15/20 228 lb 6.4 oz (103.6 kg)    Physical Exam Vitals reviewed.  Constitutional:      Appearance: Normal appearance.  HENT:     Head: Normocephalic.  Eyes:     Extraocular Movements: Extraocular movements intact.     Conjunctiva/sclera: Conjunctivae normal.     Pupils: Pupils are equal, round, and reactive to light.  Cardiovascular:     Rate and Rhythm: Regular rhythm.     Pulses: Normal pulses.     Heart sounds: Normal heart sounds.  Pulmonary:     Effort: Pulmonary effort is normal.     Breath sounds: Normal breath sounds.  Abdominal:     General: There is no distension.     Palpations: Abdomen is soft.     Tenderness: There is no abdominal tenderness.  Musculoskeletal:        General: No deformity. Normal range of motion.     Cervical back: Normal range of motion and neck supple.     Right lower leg: No edema.     Left lower leg: No edema.  Skin:    General: Skin is warm and dry.     Capillary Refill: Capillary refill takes less than 2 seconds.  Neurological:     General: No focal deficit present.     Mental Status: He is alert and oriented to person, place, and time.  Psychiatric:        Mood and Affect: Mood normal.        Behavior: Behavior normal.     ASSESSMENT & PLAN: Essential hypertension Well-controlled hypertension.  Continue losartan 50 mg daily. Dietary approach to stop hypertension discussed.  Dyslipidemia Diet and nutrition discussed.  Continue  rosuvastatin 20 mg daily. The 10-year ASCVD risk score Denman George DC Montez Hageman., et al., 2013) is: 12.7%   Values used to calculate the score:     Age: 14 years     Sex: Male     Is Non-Hispanic African American: No     Diabetic: No     Tobacco smoker: No     Systolic Blood Pressure: 128 mmHg     Is BP treated: Yes     HDL Cholesterol: 52 mg/dL     Total Cholesterol: 145 mg/dL   Prediabetes Diet  and nutrition discussed.  Advised to decrease amount of daily carbohydrate intake.  Visual problems Uncertain diagnosis for his symptoms.  Needs ophthalmology evaluation.  Referral placed today.  Chronic left shoulder pain May benefit from physical therapy.  Will refer to sports medicine.  Thigh pain, musculoskeletal, right Musculoskeletal in nature.  May benefit from physical therapy.  Will refer to sports medicine.  Lower abdominal pain Acute onset.  No red flag signs or symptoms.  Most likely benign gastroenteritis picture.  Intestinal spasms may benefit from Bentyl.  Advised to stay on BRAT diet for the next 48 hours. Armand was seen today for hypertension.  Diagnoses and all orders for this visit:  Essential hypertension -     Comprehensive metabolic panel  Dyslipidemia -     Lipid panel  Prediabetes -     Hemoglobin A1c  Need for Streptococcus pneumoniae vaccination -     Pneumococcal conjugate vaccine 20-valent (Prevnar 20)  Visual problems -     Ambulatory referral to Ophthalmology  Chronic left shoulder pain -     Ambulatory referral to Sports Medicine  Thigh pain, musculoskeletal, right -     Ambulatory referral to Sports Medicine  Lower abdominal pain -     dicyclomine (BENTYL) 10 MG capsule; Take 1 capsule (10 mg total) by mouth every 8 (eight) hours as needed for spasms.  Patient Instructions  Health Maintenance After Age 36 After age 31, you are at a higher risk for certain long-term diseases and infections as well as injuries from falls. Falls are a major cause of  broken bones and head injuries in people who are older than age 18. Getting regular preventive care can help to keep you healthy and well. Preventive care includes getting regular testing and making lifestyle changes as recommended by your health care provider. Talk with your health care provider about: Which screenings and tests you should have. A screening is a test that checks for a disease when you have no symptoms. A diet and exercise plan that is right for you. What should I know about screenings and tests to prevent falls? Screening and testing are the best ways to find a health problem early. Early diagnosis and treatment give you the best chance of managing medical conditions that are common after age 33. Certain conditions and lifestyle choices may make you more likely to have a fall. Your health care provider may recommend: Regular vision checks. Poor vision and conditions such as cataracts can make you more likely to have a fall. If you wear glasses, make sure to get your prescription updated if your vision changes. Medicine review. Work with your health care provider to regularly review all of the medicines you are taking, including over-the-counter medicines. Ask your health care provider about any side effects that may make you more likely to have a fall. Tell your health care provider if any medicines that you take make you feel dizzy or sleepy. Osteoporosis screening. Osteoporosis is a condition that causes the bones to get weaker. This can make the bones weak and cause them to break more easily. Blood pressure screening. Blood pressure changes and medicines to control blood pressure can make you feel dizzy. Strength and balance checks. Your health care provider may recommend certain tests to check your strength and balance while standing, walking, or changing positions. Foot health exam. Foot pain and numbness, as well as not wearing proper footwear, can make you more likely to have a  fall. Depression screening. You  may be more likely to have a fall if you have a fear of falling, feel emotionally low, or feel unable to do activities that you used to do. Alcohol use screening. Using too much alcohol can affect your balance and may make you more likely to have a fall. What actions can I take to lower my risk of falls? General instructions Talk with your health care provider about your risks for falling. Tell your health care provider if: You fall. Be sure to tell your health care provider about all falls, even ones that seem minor. You feel dizzy, sleepy, or off-balance. Take over-the-counter and prescription medicines only as told by your health care provider. These include any supplements. Eat a healthy diet and maintain a healthy weight. A healthy diet includes low-fat dairy products, low-fat (lean) meats, and fiber from whole grains, beans, and lots of fruits and vegetables. Home safety Remove any tripping hazards, such as rugs, cords, and clutter. Install safety equipment such as grab bars in bathrooms and safety rails on stairs. Keep rooms and walkways well-lit. Activity  Follow a regular exercise program to stay fit. This will help you maintain your balance. Ask your health care provider what types of exercise are appropriate for you. If you need a cane or walker, use it as recommended by your health care provider. Wear supportive shoes that have nonskid soles.  Lifestyle Do not drink alcohol if your health care provider tells you not to drink. If you drink alcohol, limit how much you have: 0-1 drink a day for women. 0-2 drinks a day for men. Be aware of how much alcohol is in your drink. In the [place] one drink equals one typical bottle of beer (12 oz), one-half glass of wine (5 oz), or one shot of hard liquor (1 oz). Do not use any products that contain nicotine or tobacco, such as cigarettes and e-cigarettes. If you need help quitting, ask your health care  provider. Summary Having a healthy lifestyle and getting preventive care can help to protect your health and wellness after age 66. Screening and testing are the best way to find a health problem early and help you avoid having a fall. Early diagnosis and treatment give you the best chance for managing medical conditions that are more common for people who are older than age 66. Falls are a major cause of broken bones and head injuries in people who are older than age 66. Take precautions to prevent a fall at home. Work with your health care provider to learn what changes you can make to improve your health and wellness and to prevent falls. This information is not intended to replace advice given to you by your health care provider. Make sure you discuss any questions you have with your healthcare provider. Document Revised: 03/18/2020 Document Reviewed: 03/18/2020 Elsevier Patient Education  2022 Elsevier Inc.    Edwina BarthMiguel Teressa Mcglocklin, MD [facility]

## 2020-11-25 NOTE — Progress Notes (Signed)
Subjective:    I'm seeing this patient as a consultation for:  Dr. Alvy Bimler. Note will be routed back to referring provider/PCP.  CC: L shoulder and R thigh pain  I, Molly Weber, LAT, ATC, am serving as scribe for Dr. Clementeen Graham.  HPI: Pt is a 65 y/o male presenting w/ c/o L shoulder and R thigh pain.    L shoulder: Pain x 8 months w/ no known MOI but does note that he lifts weights and that maybe something happened w/ that.  He locates his pain to his L lateral shoulder -Radiating pain: yes into his L lateral upper arm -L shoulder mechanical symptoms: No -Aggravating factors: L shoulder aBd AROM and functional IR;  -Treatments tried: OTC meds intermittently; roll-on pain-relieving gel  R thigh pain Pain x 3-4 months w/ no known MOI.  He locates his pain to his R post thigh w/ radiating pain intermittently below his L knee.  He describes his pain initially as a warmth and is now more mild. -Low back pain: No -LE numbness/tingling: No -Aggravating factors: unknown; sometimes when driving; prolonged positioning whether sitting or standing -Treatments tried: OTC meds intermittently; roll-on pain-relieving gel and home exercise program as directed by physician as noted below. He has had evaluation and management with this with a physician in [place] starting 3 months ago.  He has had trials of home exercise program as directed by the physician.  Elieser spends a lot of time out of the country in [place].  He runs an important business.  He is planning to return to [place] September 26 for several months.  Past medical history, Surgical history, Family history, Social history, Allergies, and medications have been entered into the medical record, reviewed.   Review of Systems: No new headache, visual changes, nausea, vomiting, diarrhea, constipation, dizziness, abdominal pain, skin rash, fevers, chills, night sweats, weight loss, swollen lymph nodes, body aches, joint swelling, muscle aches,  chest pain, shortness of breath, mood changes, visual or auditory hallucinations.   Objective:    Vitals:   11/28/20 0916  BP: 130/86  Pulse: 66  SpO2: 98%   General: Well Developed, well nourished, and in no acute distress.  Neuro/Psych: Alert and oriented x3, extra-ocular muscles intact, able to move all 4 extremities, sensation grossly intact. Skin: Warm and dry, no rashes noted.  Respiratory: Not using accessory muscles, speaking in full sentences, trachea midline.  Cardiovascular: Pulses palpable, no extremity edema. Abdomen: Does not appear distended. MSK:  Left shoulder normal-appearing Nontender. Normal motion pain with abduction and internal rotation. Intact strength. Mildly positive Hawkins and Neer's test. Negative empty can test. Negative Yergason's and speeds test.  L-spine nontender midline normal lumbar motion positive right-sided slump test. Lower extremity strength reflexes and sensation are equal and normal bilaterally.  Right leg nontender.  Lab and Radiology Results  Diagnostic Limited MSK Ultrasound of: Left shoulder Biceps tendon intact normal. Subscapularis tendon intact normal. Supraspinatus tendon is intact. Mild subacromial bursitis present. Infraspinatus tendon is intact. AC joint mild effusion. Impression: Subacromial bursitis  X-ray images left shoulder and L-spine obtained today personally and independently interpreted  Left shoulder: Acute fractures.  Mild AC DJD.  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  No acute fractures.  Possible age-indeterminate wedge deformity of L5.  Await formal radiology review   Impression and Recommendations:    Assessment and Plan: 65 y.o. male with left shoulder pain ongoing for months now.  Pain thought to be due to impingement and subacromial bursitis.  Plan  for physical therapy and recheck in 1 month.  If not improved consider injection.  Right posterior leg pain concern for lumbar radiculopathy at L5  or S1.  Patient is already had trials of conservative management for this in [place] by physician with physician directed conservative management for the last 3 months.  He has failed to improve.  Plan to proceed with x-ray and MRI to further characterize cause of pain and for potential epidural steroid injection planning.  Trial of gabapentin.  Likely proceed directly to epidural steroid injection following results of MRI.   PDMP not reviewed this encounter. Orders Placed This Encounter  Procedures   [place] LIMITED JOINT SPACE STRUCTURES UP LEFT(NO LINKED CHARGES)    Order Specific Question:   Reason for Exam (SYMPTOM  OR DIAGNOSIS REQUIRED)    Answer:   eval left shoulder pain    Order Specific Question:   Preferred imaging location?    Answer:    [facility] Shoulder Left    Standing Status:   Future    Number of Occurrences:   1    Standing Expiration Date:   11/28/2021    Order Specific Question:   Reason for Exam (SYMPTOM  OR DIAGNOSIS REQUIRED)    Answer:   eval left shoulder pain    Order Specific Question:   Preferred imaging location?    Answer:   [facility]   DG Lumbar Spine 2-3 Views    Standing Status:   Future    Number of Occurrences:   1    Standing Expiration Date:   11/28/2021    Order Specific Question:   Reason for Exam (SYMPTOM  OR DIAGNOSIS REQUIRED)    Answer:   eval right lumbar rad poss L5 or S1    Order Specific Question:   Preferred imaging location?    Answer:   [facility]   MR Lumbar Spine Wo Contrast    Standing Status:   Future    Standing Expiration Date:   11/28/2021    Order Specific Question:   What is the patient's sedation requirement?    Answer:   No Sedation    Order Specific Question:   Does the patient have a pacemaker or implanted devices?    Answer:   No    Order Specific Question:   Preferred imaging location?    Answer:   [facility] (table limit-350lbs)   Ambulatory referral to  Physical Therapy    Referral Priority:   Routine    Referral Type:   Physical Medicine    Referral Reason:   Specialty Services Required    Requested Specialty:   Physical Therapy    Number of Visits Requested:   1   Meds ordered this encounter  Medications   gabapentin (NEURONTIN) 300 MG capsule    Sig: Take 1 capsule (300 mg total) by mouth 3 (three) times daily as needed.    Dispense:  90 capsule    Refill:  3    Discussed warning signs or symptoms. Please see discharge instructions. Patient expresses understanding.   The above documentation has been reviewed and is accurate and complete Clementeen Graham, M.D.

## 2020-11-28 ENCOUNTER — Encounter: Payer: Self-pay | Admitting: Family Medicine

## 2020-11-28 ENCOUNTER — Ambulatory Visit (INDEPENDENT_AMBULATORY_CARE_PROVIDER_SITE_OTHER): Payer: Managed Care, Other (non HMO) | Admitting: Family Medicine

## 2020-11-28 ENCOUNTER — Ambulatory Visit: Payer: Self-pay

## 2020-11-28 ENCOUNTER — Ambulatory Visit (INDEPENDENT_AMBULATORY_CARE_PROVIDER_SITE_OTHER): Payer: Managed Care, Other (non HMO)

## 2020-11-28 ENCOUNTER — Other Ambulatory Visit: Payer: Self-pay

## 2020-11-28 VITALS — BP 130/86 | HR 66 | Ht 68.0 in | Wt 204.8 lb

## 2020-11-28 DIAGNOSIS — M5416 Radiculopathy, lumbar region: Secondary | ICD-10-CM

## 2020-11-28 DIAGNOSIS — M25512 Pain in left shoulder: Secondary | ICD-10-CM

## 2020-11-28 MED ORDER — GABAPENTIN 300 MG PO CAPS: 300.0000 mg | ORAL_CAPSULE | Freq: Three times a day (TID) | ORAL | 3 refills | Status: AC | PRN

## 2020-11-28 NOTE — Progress Notes (Signed)
X-ray lumbar spine shows significant arthritis changes and potential for pinched nerve.  MRI should be helpful.

## 2020-11-28 NOTE — Patient Instructions (Addendum)
Thank you for coming in today.   I've referred you to Physical Therapy.  Let us know if you don't hear from them in one week.   Please get an Xray today before you leave   You should hear from MRI scheduling within 1 week. If you do not hear please let me know.    Return in about 1 month. If not improving I can do a shoulder injection.   I should already have the MRI of the lumbar spine back and have already ordered a back injection. We can talk about the results in more detail then.  Use the gabapentin as needed.

## 2020-11-28 NOTE — Progress Notes (Signed)
Left shoulder x-ray shows some arthritis of the small joint at the top of the shoulder otherwise it looks normal to radiology

## 2020-12-01 ENCOUNTER — Telehealth: Payer: Self-pay

## 2020-12-01 NOTE — Telephone Encounter (Signed)
Patient requesting a prescription eye for his allergies

## 2020-12-03 ENCOUNTER — Other Ambulatory Visit: Payer: Self-pay | Admitting: Emergency Medicine

## 2020-12-03 MED ORDER — AZELASTINE HCL 0.05 % OP SOLN: 1.0000 [drp] | Freq: Two times a day (BID) | OPHTHALMIC | 1 refills | Status: AC

## 2020-12-03 NOTE — Telephone Encounter (Signed)
Prescription sent to local pharmacy of record.  Thanks.

## 2020-12-06 ENCOUNTER — Ambulatory Visit: Payer: Managed Care, Other (non HMO) | Attending: Family Medicine

## 2020-12-06 ENCOUNTER — Other Ambulatory Visit: Payer: Self-pay

## 2020-12-06 DIAGNOSIS — R252 Cramp and spasm: Secondary | ICD-10-CM | POA: Diagnosis present

## 2020-12-06 DIAGNOSIS — M5416 Radiculopathy, lumbar region: Secondary | ICD-10-CM

## 2020-12-06 DIAGNOSIS — M6281 Muscle weakness (generalized): Secondary | ICD-10-CM

## 2020-12-06 DIAGNOSIS — M25512 Pain in left shoulder: Secondary | ICD-10-CM | POA: Diagnosis present

## 2020-12-06 DIAGNOSIS — R600 Localized edema: Secondary | ICD-10-CM | POA: Diagnosis present

## 2020-12-06 NOTE — Patient Instructions (Signed)
Access Code: [email or web address] URL: [email or web address] Date: 12/06/2020 Prepared by: Claude Manges  Exercises Seated Figure 4 Piriformis Stretch - 1 x daily - 7 x weekly - 4 sets - 10-20 seconds hold Seated Hamstring Stretch - 1 x daily - 7 x weekly - 4 sets - 10-20 seconds hold Supine 90/90 Sciatic Nerve Glide with Knee Flexion/Extension - 1 x daily - 7 x weekly - 1 sets - 10 reps Standing Shoulder Abduction Finger Walk at Wall - 1 x daily - 7 x weekly - 2 sets - 10 reps Shoulder External Rotation and Scapular Retraction with Resistance - 1 x daily - 7 x weekly - 2 sets - 10 reps Ice - 1 x daily - 7 x weekly - 10-20 minutes hold

## 2020-12-06 NOTE — Therapy (Signed)
[facility] [address] [place], [place], [address] Phone: [phone]   Fax:  [phone]  Physical Therapy Evaluation  Patient Details  Name: Andrew Murray MRN: [id number] Date of Birth: 11-23-1955 Referring Provider (PT): Cheron Everyorey   Encounter Date: 12/06/2020   PT End of Session - 12/06/20 1045     Visit Number 1    Date for PT Re-Evaluation 01/17/21    PT Start Time 0958    PT Stop Time 1040    PT Time Calculation (min) 42 min    Activity Tolerance Patient tolerated treatment well;Patient limited by pain    Behavior During Therapy [facility] for tasks assessed/performed             Past Medical History:  Diagnosis Date   Allergy    Asthma    Hyperlipidemia    Hypertension    Seasonal allergies     Past Surgical History:  Procedure Laterality Date   CARDIAC VALVE SURGERY     10 yrs ago   HERNIA REPAIR      There were no vitals filed for this visit.    Subjective Assessment - 12/06/20 1001     Subjective L shoulder: Pain ~8 months with unknown  MOI but does note that he lifts weights and that maybe something happened w/ that.  He locates his pain to his L lateral shoulder that radiated into L lateral upper arm. Worse wth moving arm out to side or behind him. Has tried OTC meds and pain relieving gel with some help.     R thigh pain Pain: approximately past 3-4 months with unknown MOI.  He locates his pain to his R posterior thigh w/ radiating pain intermittently below his L knee.  He describes his pain initially as a warmth and is now more mild. He denies LBP but does report some numbness when sitting for a while. Pain worsens sometimes when driving; prolonged positioning whether sitting or standing  -Treatments tried: OTC meds intermittently; roll-on pain-relieving gel and home exercise program as directed by physician as noted below.  He has had evaluation and management with this with a physician in [place]  starting 3 months ago.  He has had trials of home exercise program as directed by the physician.     Nada MaclachlanSalim spends a lot of time out of the country in [place].  He runs an important business.  He is planning to return to [place] September 26 for several months.    Pertinent History had a stent placement 8-10 years ago, hx of fatigue. HTN, hx of asthma but not recently problematic    Patient Stated Goals less pain, get back to gym exercises    Currently in Pain? Yes    Pain Score 8     Pain Location Shoulder    Pain Orientation Left    Pain Type Acute pain    Pain Onset More than a month ago    Pain Frequency Intermittent   with movement   Multiple Pain Sites Yes    Pain Score 8    Pain Location --   thigh   Pain Orientation Right;Posterior    Pain Radiating Towards into posterior R thigh, occasional into calf. Does report occasional numbness into back of leg (did report it happened this morning)    Pain Onset More than a month ago    Pain Frequency Intermittent  [facility] PT Assessment - 12/06/20 0951       Assessment   Medical Diagnosis M25.512 (ICD-10-CM) - Acute pain of left shoulder (impingement and subacromial bursitis)  M54.16 (ICD-10-CM) - Lumbar radiculitis (R thigh pain)    Referring Provider (PT) Cherlynn June Dominance Right    Next MD Visit Sept 12th back to Dr Denyse Amass      Balance Screen   Has the patient fallen in the past 6 months No      Home Environment   Additional Comments house, with family.      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 L shoulder  flexion 155 deg, abd 160 but with mild pain. Functional ER: L to T3 with discomfort, R to T5. Functional IR: B to T12 but LUE with pain. lumbar grossly 75% pain in RLE posterior with forward flexion only.      Strength   Overall Strength Comments Shoulder: RUE grossly 4+/5. L shoulder 3+/5 with pain. L elbow flex 4-/5. Shoulder ER and IR grossly  3+/5 very painfl. Decreased functiona core core strength. BLE gorssly 4/5 with increased RLE discomfort with MMT.      Flexibility   Soft Tissue Assessment /Muscle Length yes    Hamstrings very tight, some discomfort on the R side    Piriformis very tight, some discomfort on the R side      Palpation   Palpation comment mild TTP anterior/lateral L shoulder, mild local swelling compared to right shoulder                        Objective measurements completed on examination: See above findings.               PT Education - 12/06/20 1043     Education Details PT POC and initial HEP for L shoulder AAROM, RTC strength and pain mgmt in addition to LE flexibility. Access Code: [email or web address]    Person(s) Educated Patient    Methods Explanation;Demonstration;Handout    Comprehension Verbalized understanding;Returned demonstration              PT Short Term Goals - 12/06/20 1248       PT SHORT TERM GOAL #1   Title Independent with initial HEP    Time 2    Period Weeks    Status New    Target Date 12/20/20               PT Long Term Goals - 12/06/20 1248       PT LONG TERM GOAL #1   Title Independent and safe with advance HEP and gym program    Time 6    Period Weeks    Status New    Target Date 01/17/21      PT LONG TERM GOAL #2   Title L Shoulder FOTO improved to at least 70%    Time 6    Period Weeks    Status  New    Target Date 01/17/21      PT LONG TERM GOAL #3   Title Report decreased RLE pain and radicular symptoms to at least <2/10 with driving, ADLs    Time 6    Period Weeks    Status  New    Target Date 01/17/21      PT LONG TERM GOAL #4   Title Improved LUE strength to at least 4+/5 with </=1/10 pain    Time 6    Period Weeks    Status New    Target Date 01/17/21      PT LONG TERM GOAL #5   Title Improved RLE flexibility to be symmetrical to LLE with </= 2/10 pain    Time 6    Period Weeks    Status New    Target Date 01/17/21                    Plan - 12/06/20 1239     Clinical Impression Statement Pt is a 66 yo male who was reffered for left shoulder pain (suspected impingement and subacromial bursitis) and for lumbar radiculitis into RLE. L shoulder pain persists primarily with movement, not bothersome at rest, and ROM grossly within 5 degrees of right UE for all motions however with pain and discomfort in addition to weakness. Lumbar ROM grossly WFL but with limitaitons and pain moreso with forward flexion which increased RLE pain, RLE flexibility also limited compared to the left uninvolved side. He does report that posterior thigh pain does occasionally go down to behind the calf and does occasionally feel numbness in the area. 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 He will benefit from skilled PT to work towards decreasing pain and improving overall functional mobility/strength to work back towards independent exercise program.    Personal Factors and Comorbidities Comorbidity 2    Stability/Clinical Decision Making Evolving/Moderate complexity    Clinical Decision Making Moderate    Rehab Potential Good    PT Frequency 2x / week    PT Duration 6 weeks    PT Treatment/Interventions ADLs/Self Care Home Management;Cryotherapy;Electrical Stimulation;Iontophoresis 4mg /ml Dexamethasone;Moist Heat;Functional mobility training;Therapeutic activities;Therapeutic exercise;Balance training;Neuromuscular re-education;Manual techniques;Patient/family  education;Taping;Vasopneumatic Device    PT Next Visit Plan Reassess and progress HEP for L shoulder and Lumbar radic/RLE pain. Gradual RTC/shoulder, core, postural strength. RLE flexibility and body mechanics. May benefir further from sciatic nerve glides. Progress to machines and weights as tolerated.  Manual and modalities as needed.    PT Home Exercise Plan see pt instructions    Consulted and Agree with Plan of Care Patient             Patient will benefit from skilled therapeutic intervention in order to improve the following deficits and impairments:  Pain, Impaired UE functional use, Increased muscle spasms, Decreased strength, Decreased mobility, Decreased balance, Impaired flexibility, Improper body mechanics  Visit Diagnosis: Radiculopathy, lumbar region  Acute pain of left shoulder  Muscle weakness (generalized)  Cramp and spasm  Localized edema     Problem List Patient Active Problem List   Diagnosis Date Noted   Prediabetes 11/24/2020   Visual problems 11/24/2020   Chronic left shoulder pain 11/24/2020   Thigh pain, musculoskeletal, right 11/24/2020   Lower abdominal pain 11/24/2020   Lower urinary tract symptoms (LUTS) 11/01/2017   Prostate enlargement 11/01/2017   Essential hypertension 09/20/2017  Dyslipidemia 09/20/2017   SNORING 11/16/2008    Anson Crofts, PT, DPT 12/06/2020, 12:58 PM  [facility] [address] [place], [place], [address] Phone: [phone]   Fax:  [phone]  Name: Andrew Murray MRN: [id number] Date of Birth: 1956/01/30

## 2020-12-09 ENCOUNTER — Ambulatory Visit: Payer: Managed Care, Other (non HMO)

## 2020-12-09 ENCOUNTER — Other Ambulatory Visit: Payer: Self-pay

## 2020-12-09 DIAGNOSIS — R600 Localized edema: Secondary | ICD-10-CM

## 2020-12-09 DIAGNOSIS — M5416 Radiculopathy, lumbar region: Secondary | ICD-10-CM

## 2020-12-09 DIAGNOSIS — M25512 Pain in left shoulder: Secondary | ICD-10-CM

## 2020-12-09 DIAGNOSIS — M6281 Muscle weakness (generalized): Secondary | ICD-10-CM

## 2020-12-09 DIAGNOSIS — R252 Cramp and spasm: Secondary | ICD-10-CM

## 2020-12-09 NOTE — Therapy (Signed)
[facility] [address] [place], [place], [address] Phone: [phone]   Fax:  [phone]  Physical Therapy Treatment  Patient Details  Name: Andrew Murray MRN: [id number] Date of Birth: Nov 05, 1955 Referring Provider (PT): Cheron Every Date: 12/09/2020   PT End of Session - 12/09/20 0938     Visit Number 2    Date for PT Re-Evaluation 01/17/21    PT Start Time 0933    PT Stop Time 1015    PT Time Calculation (min) 42 min    Activity Tolerance Patient tolerated treatment well;Patient limited by pain    Behavior During Therapy [facility] for tasks assessed/performed             Past Medical History:  Diagnosis Date   Allergy    Asthma    Hyperlipidemia    Hypertension    Seasonal allergies     Past Surgical History:  Procedure Laterality Date   CARDIAC VALVE SURGERY     10 yrs ago   HERNIA REPAIR      There were no vitals filed for this visit.   Subjective Assessment - 12/09/20 0936     Subjective L shoulder feeling a little bit better, did some exercises yesterday. Did notice some numb behind left leg as well as right after sitting on hard seat for prolonged time.    Pertinent History had a stent placement 8-10 years ago, hx of fatigue. HTN, hx of asthma but not recently problematic    Patient Stated Goals less pain, get back to gym exercises    Currently in Pain? Yes    Pain Score 7     Pain Location Shoulder    Pain Orientation Left    Pain Onset More than a month ago    Pain Score 8    Pain Location --   right leg posterior   Pain Orientation Right;Posterior    Pain Onset More than a month ago                               [facility] Adult PT Treatment/Exercise - 12/09/20 0001       Neuro Re-ed    Neuro Re-ed Details  sciatic nerve glides x 10 in seated B      Exercises   Exercises Shoulder;Lumbar;Knee/Hip      Lumbar Exercises: Stretches   Active Hamstring Stretch  Right;Left;2 reps;30 seconds   with strap     Lumbar Exercises: Aerobic   UBE (Upper Arm Bike) L1.5 3 min each way in standing      Lumbar Exercises: Machines for Strengthening   Cybex Knee Extension attempted in seated knee ext machine, exacerbated posterior R thigh symptomst    Cybex Knee Flexion 25# 2 x 10    Leg Press 40# 2 x10 max cues for pacing, prevent knee hyperext      Lumbar Exercises: Standing   Row Strengthening;Power tower;Both   2 x 10 10# each   Shoulder Extension Strengthening;Power Tower;Both;20 reps   10# each arm   Other Standing Lumbar Exercises red TB scap stab on wall x 5 B      Shoulder Exercises: ROM/Strengthening   Cybex Press Limitations lats 25# 2 x 10    Cybex Row --   25# 2 x 10                   PT  Education - 12/09/20 1050     Education Details Added machine exercises to home program to initiate some gym activities - Access Code: [email or web address]  utilizing starting weights trialed in todays session    Person(s) Educated Patient    Methods Handout              PT Short Term Goals - 12/06/20 1248       PT SHORT TERM GOAL #1   Title Independent with initial HEP    Time 2    Period Weeks    Status New    Target Date 12/20/20               PT Long Term Goals - 12/06/20 1248       PT LONG TERM GOAL #1   Title Independent and safe with advance HEP and gym program    Time 6    Period Weeks    Status New    Target Date 01/17/21      PT LONG TERM GOAL #2   Title L Shoulder FOTO improved to at least 70%    Time 6    Period Weeks    Status New    Target Date 01/17/21      PT LONG TERM GOAL #3   Title Report decreased RLE pain and radicular symptoms to at least <2/10 with driving, ADLs    Time 6    Period Weeks    Status New    Target Date 01/17/21      PT LONG TERM GOAL #4   Title Improved LUE strength to at least 4+/5 with </=1/10 pain    Time 6    Period Weeks    Status New    Target Date 01/17/21      PT LONG  TERM GOAL #5   Title Improved RLE flexibility to be symmetrical to LLE with </= 2/10 pain    Time 6    Period Weeks    Status New    Target Date 01/17/21                   Plan - 12/09/20 1020     Clinical Impression Statement Pt tolerated all interventions well today without c/o increased soulder pain. RLE posterior pain only increased primarily with sitting on harder seat for long time which we managed by breaks during machine exercises. Given determination to return to gym activities, we spent today trialing machines and we discussed safe weights and performance of activities to work on strength. Cues needed to prevent knee hyperext with leg press. Cues needed for scap retraction and posture with upper body machines. Fatigue noted toward end reps with scap stab, shoudler extensions. Reassess and progress next visit.    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 Gradual RTC/shoulder, core, postural strength. RLE flexibility and body mechanics. May benefir further from sciatic nerve glides. Progress to machines and weights as tolerated.  Manual and modalities as needed.  PT Home Exercise Plan see pt instructions    Consulted and Agree with Plan of Care Patient             Patient will benefit from skilled  therapeutic intervention in order to improve the following deficits and impairments:  Pain, Impaired UE functional use, Increased muscle spasms, Decreased strength, Decreased mobility, Decreased balance, Impaired flexibility, Improper body mechanics  Visit Diagnosis: Radiculopathy, lumbar region  Acute pain of left shoulder  Muscle weakness (generalized)  Cramp and spasm  Localized edema     Problem List Patient Active Problem List   Diagnosis Date Noted   Prediabetes 11/24/2020   Visual problems 11/24/2020   Chronic left shoulder pain 11/24/2020   Thigh pain, musculoskeletal, right 11/24/2020   Lower abdominal pain 11/24/2020   Lower urinary tract symptoms (LUTS) 11/01/2017   Prostate enlargement 11/01/2017   Essential hypertension 09/20/2017   Dyslipidemia 09/20/2017   SNORING 11/16/2008    Anson Crofts, PT, DPT 12/09/2020, 11:07 AM  [facility] [address] [place], [place], [address] Phone: [phone]   Fax:  [phone]  Name: Andrew Murray MRN: [id number] Date of Birth: [birth year]

## 2020-12-21 ENCOUNTER — Encounter: Payer: Self-pay | Admitting: Rehabilitative and Restorative Service Providers"

## 2020-12-21 ENCOUNTER — Ambulatory Visit
Payer: Managed Care, Other (non HMO) | Attending: Family Medicine | Admitting: Rehabilitative and Restorative Service Providers"

## 2020-12-21 ENCOUNTER — Other Ambulatory Visit: Payer: Self-pay

## 2020-12-21 DIAGNOSIS — R252 Cramp and spasm: Secondary | ICD-10-CM | POA: Insufficient documentation

## 2020-12-21 DIAGNOSIS — M6281 Muscle weakness (generalized): Secondary | ICD-10-CM | POA: Insufficient documentation

## 2020-12-21 DIAGNOSIS — M25512 Pain in left shoulder: Secondary | ICD-10-CM | POA: Insufficient documentation

## 2020-12-21 DIAGNOSIS — M5416 Radiculopathy, lumbar region: Secondary | ICD-10-CM | POA: Insufficient documentation

## 2020-12-21 NOTE — Therapy (Signed)
[place]. [place], [place], [address] Phone: [phone]   Fax:  [phone]  Physical Therapy Treatment  Patient Details  Name: Andrew Murray MRN: [id number] Date of Birth: 20-Aug-1955 Referring Provider (PT): Marikay Alar Date: 12/21/2020   PT End of Session - 12/21/20 0903     Visit Number 3    Date for PT Re-Evaluation 01/17/21    PT Start Time 0853    PT Stop Time 0931    PT Time Calculation (min) 38 min    Activity Tolerance Patient tolerated treatment well;Patient limited by pain    Behavior During Therapy [facility] for tasks assessed/performed             Past Medical History:  Diagnosis Date   Allergy    Asthma    Hyperlipidemia    Hypertension    Seasonal allergies     Past Surgical History:  Procedure Laterality Date   CARDIAC VALVE SURGERY     10 yrs ago   HERNIA REPAIR      There were no vitals filed for this visit.   Subjective Assessment - 12/21/20 0901     Subjective Pt reports that he is okay    Patient Stated Goals less pain, get back to gym exercises    Currently in Pain? Yes    Pain Score 5     Pain Location Shoulder    Pain Orientation Left    Pain Score 6    Pain Location Leg    Pain Orientation Right;Upper;Posterior                               [facility] Adult PT Treatment/Exercise - 12/21/20 0001       Neuro Re-ed    Neuro Re-ed Details  sciatic nerve glides x 10 in seated B      Lumbar Exercises: Stretches   Active Hamstring Stretch Right;Left;3 reps;20 seconds    Active Hamstring Stretch Limitations supine 90/90 stretch      Lumbar Exercises: Aerobic   UBE (Upper Arm Bike) L1.5 3 min each way      Lumbar Exercises: Machines for Strengthening   Cybex Knee Extension 15# 2x10    Cybex Knee Flexion 25# 2 x 10    Leg Press 40# 3 way x12 each      Lumbar Exercises: Standing   Row Strengthening;Power tower;Both    Row Limitations  10# 2x10    Shoulder Extension Strengthening;Power Tower;Both;20 reps    Shoulder Extension Limitations 10# 2x10    Other Standing Lumbar Exercises red TB 3 way scap stab on wall x 5 B                  Upper Extremity Functional Index Score :   /80     PT Short Term Goals - 12/21/20 0927       PT SHORT TERM GOAL #1   Title Independent with initial HEP    Status Partially Met   Pt reports doing them sometimes when he thinks about it, but is walking daily.              PT Long Term Goals - 12/06/20 1248       PT LONG TERM GOAL #1   Title Independent and safe with advance HEP and gym program    Time 6    Period Weeks  Status New    Target Date 01/17/21      PT LONG TERM GOAL #2   Title L Shoulder FOTO improved to at least 70%    Time 6    Period Weeks    Status New    Target Date 01/17/21      PT LONG TERM GOAL #3   Title Report decreased RLE pain and radicular symptoms to at least <2/10 with driving, ADLs    Time 6    Period Weeks    Status New    Target Date 01/17/21      PT LONG TERM GOAL #4   Title Improved LUE strength to at least 4+/5 with </=1/10 pain    Time 6    Period Weeks    Status New    Target Date 01/17/21      PT LONG TERM GOAL #5   Title Improved RLE flexibility to be symmetrical to LLE with </= 2/10 pain    Time 6    Period Weeks    Status New    Target Date 01/17/21                   Plan - 12/21/20 0924     Clinical Impression Statement Mr Winsor arrived late for his appointment today.  Tolerated treatment session without complaints of increased pain, doing better than last session.  He states that he was in [place] over the weekend and walked several miles each day.  He continues to require cuing during ther ex for improved posture and improved core stability.  He did not fatigue as quickly as last session.    PT Treatment/Interventions ADLs/Self Care Home Management;Cryotherapy;Electrical  Stimulation;Iontophoresis 68m/ml Dexamethasone;Moist Heat;Functional mobility training;Therapeutic activities;Therapeutic exercise;Balance training;Neuromuscular re-education;Manual techniques;Patient/family education;Taping;Vasopneumatic Device    PT Next Visit Plan Reassess and progress HEP for L shoulder and Lumbar radic/RLE pain. Gradual RTC/shoulder, core, postural strength. RLE flexibility and body mechanics. Manual and modalities as needed.    Consulted and Agree with Plan of Care Patient             Patient will benefit from skilled therapeutic intervention in order to improve the following deficits and impairments:  Pain, Impaired UE functional use, Increased muscle spasms, Decreased strength, Decreased mobility, Decreased balance, Impaired flexibility, Improper body mechanics  Visit Diagnosis: Radiculopathy, lumbar region  Acute pain of left shoulder  Muscle weakness (generalized)  Cramp and spasm     Problem List Patient Active Problem List   Diagnosis Date Noted   Prediabetes 11/24/2020   Visual problems 11/24/2020   Chronic left shoulder pain 11/24/2020   Thigh pain, musculoskeletal, right 11/24/2020   Lower abdominal pain 11/24/2020   Lower urinary tract symptoms (LUTS) 11/01/2017   Prostate enlargement 11/01/2017   Essential hypertension 09/20/2017   Dyslipidemia 09/20/2017   SNORING 11/16/2008    SJuel Burrow PT 12/21/2020, 9:32 AM  [place] [address]Phone: [phone]  Fax:  [phone] Name: Andrew LugerMRN: [id number] of Birth: 4Jan 21, 1957

## 2020-12-23 ENCOUNTER — Ambulatory Visit: Payer: Self-pay | Admitting: Rehabilitative and Restorative Service Providers"

## 2020-12-23 NOTE — Progress Notes (Deleted)
   I, Christoper Fabian, LAT, ATC, am serving as scribe for Dr. Clementeen Graham.  Andrew Murray is a 65 y.o. male who presents to [facility] today for f/u of L shoulder pain thought to be due to impingement/subacromial bursitis and R post thigh pain likely due to lumbar radiculopathy.  He was last seen by Dr. Denyse Amass on 11/28/20 and was referred for an L-spine MRI and to PT of which he's completed 3 sessions.  He was also prescribed Gabapentin.  Today, pt reports       Diagnostic imaging: L-spine and L shoulder XR- 11/28/20  Pertinent review of systems: ***  Relevant historical information: ***   Exam:  There were no vitals taken for this visit. General: Well Developed, well nourished, and in no acute distress.   MSK: ***    Lab and Radiology Results No results found for this or any previous visit (from the past 72 hour(s)). No results found.     Assessment and Plan: 65 y.o. male with ***   PDMP not reviewed this encounter. No orders of the defined types were placed in this encounter.  No orders of the defined types were placed in this encounter.    Discussed warning signs or symptoms. Please see discharge instructions. Patient expresses understanding.   ***

## 2020-12-26 ENCOUNTER — Ambulatory Visit: Payer: Managed Care, Other (non HMO) | Admitting: Family Medicine

## 2020-12-27 ENCOUNTER — Ambulatory Visit: Payer: Self-pay | Admitting: Rehabilitative and Restorative Service Providers"

## 2020-12-29 ENCOUNTER — Ambulatory Visit: Payer: Managed Care, Other (non HMO) | Admitting: Rehabilitative and Restorative Service Providers"

## 2021-01-03 ENCOUNTER — Ambulatory Visit: Payer: Managed Care, Other (non HMO) | Admitting: Rehabilitative and Restorative Service Providers"

## 2021-01-05 ENCOUNTER — Encounter: Payer: Managed Care, Other (non HMO) | Admitting: Rehabilitative and Restorative Service Providers"

## 2021-05-29 ENCOUNTER — Ambulatory Visit: Payer: Managed Care, Other (non HMO) | Admitting: Emergency Medicine

## 2021-06-21 ENCOUNTER — Encounter: Payer: Self-pay | Admitting: Physical Therapy

## 2021-06-21 NOTE — Therapy (Signed)
[place] [facility] [address] ?[place], [place], [address] ?Phone: [phone]   Fax:  [phone] ? ?Patient Details  ?Name: Andrew Murray ?MRN: [id number] ?Date of Birth: 1955/07/12 ?Referring Provider:  No ref. provider found ? ?Encounter Date: 06/21/2021 ? ?PHYSICAL THERAPY DISCHARGE SUMMARY ? ?Visits from Start of Care: 3 ? ?Current functional level related to goals / functional outcomes: ?Has not returned since last scheduled session, DC per clinic policy  ?  ?Remaining deficits: ?Unable to assess  ?  ?Education / Equipment: ?Unable to assess   ? ?Patient agrees to discharge. Patient goals were not met. Patient is being discharged due to not returning since the last visit. ? ? ?[place] Searles U PT, DPT, PN2  ? ?Supplemental Physical Therapist ?[place]  ? ? ? ? ? ?[place] ?[address] ?[place], [place], [address] ?Phone: [phone]   Fax:  [phone] ?

## 2021-07-13 ENCOUNTER — Ambulatory Visit: Payer: Self-pay | Admitting: Emergency Medicine

## 2022-07-06 IMAGING — DX DG LUMBAR SPINE 2-3V
3 series · 3 of 3 positions shown · non-contrast
Comparison: None.

CLINICAL DATA: Right-sided lumbar radiculopathy

EXAM:
LUMBAR SPINE - 2-3 VIEW

[l-spine ap]
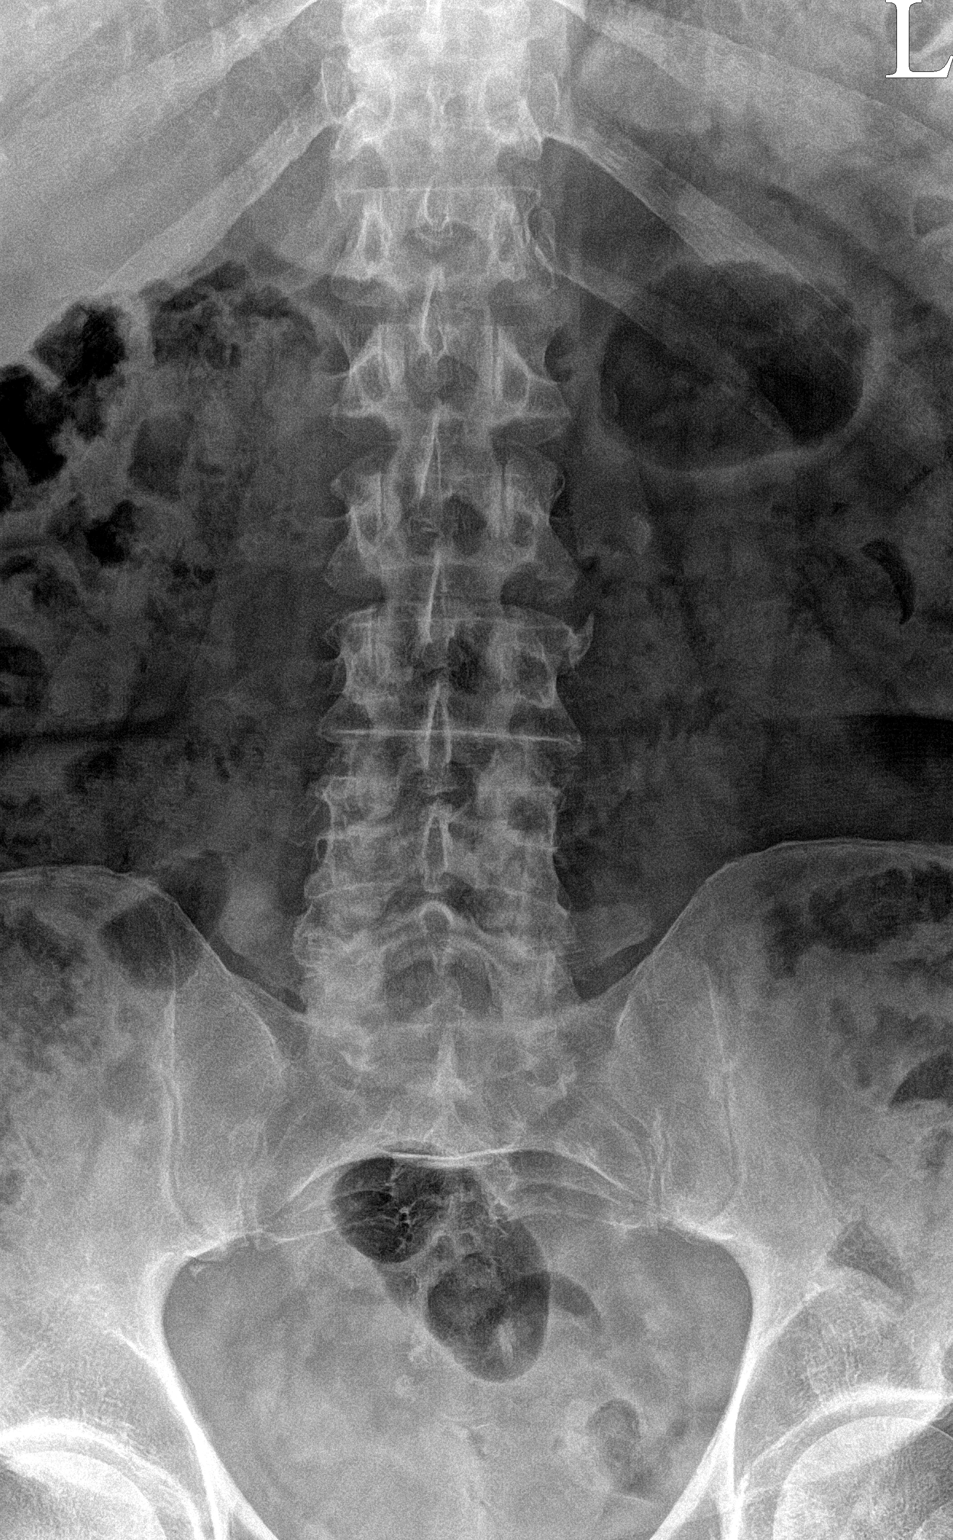

[l-spine lateral (1 of 2)]
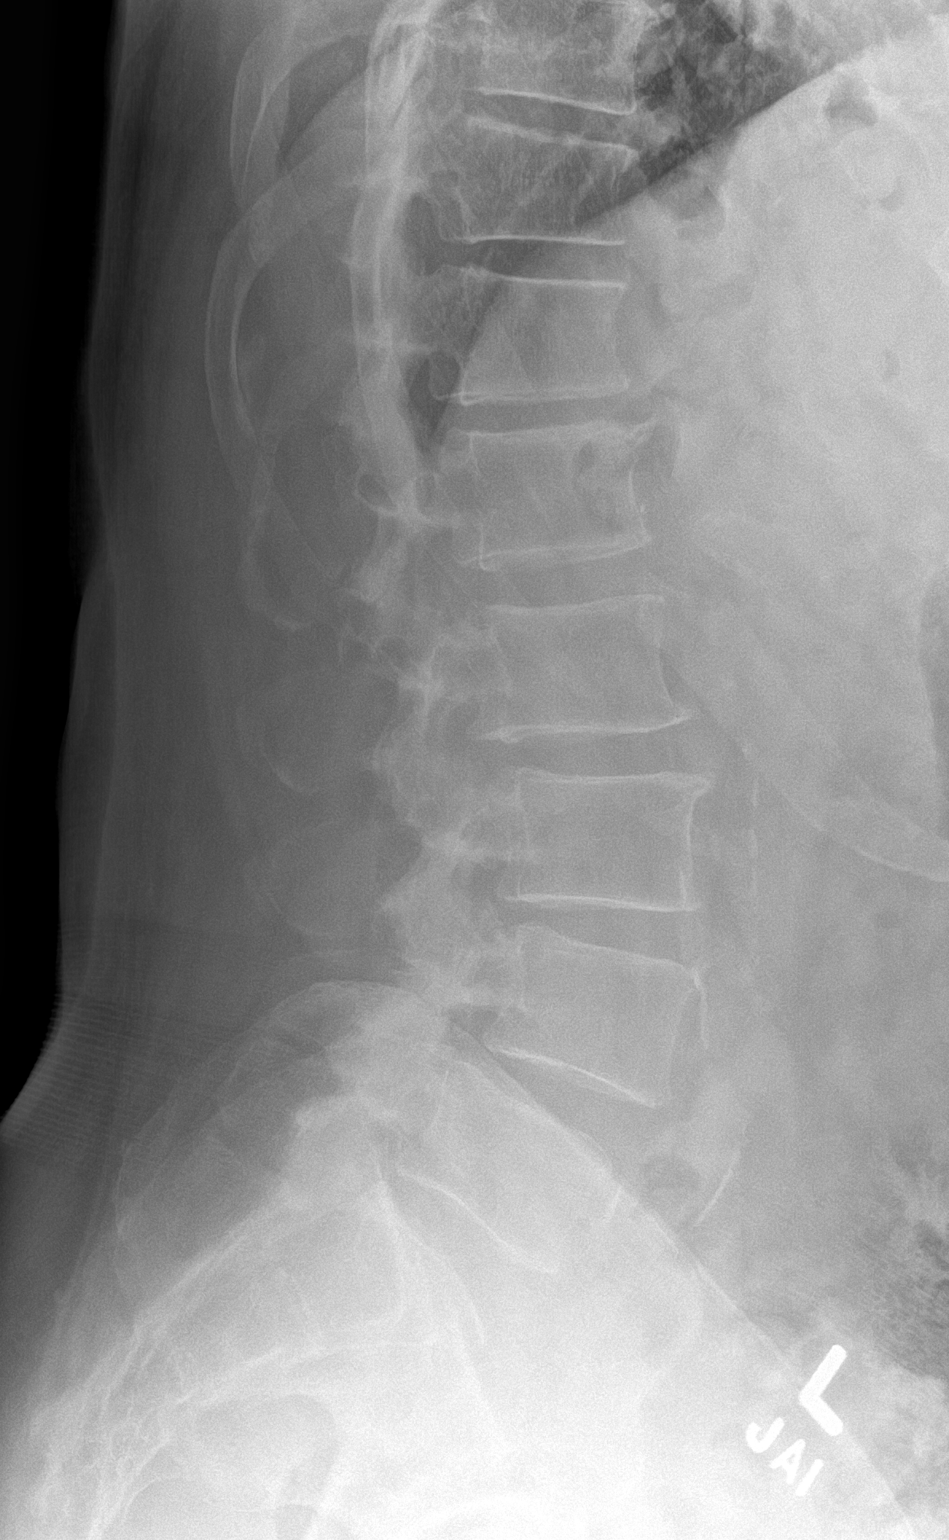

[l-spine lateral (2 of 2)]
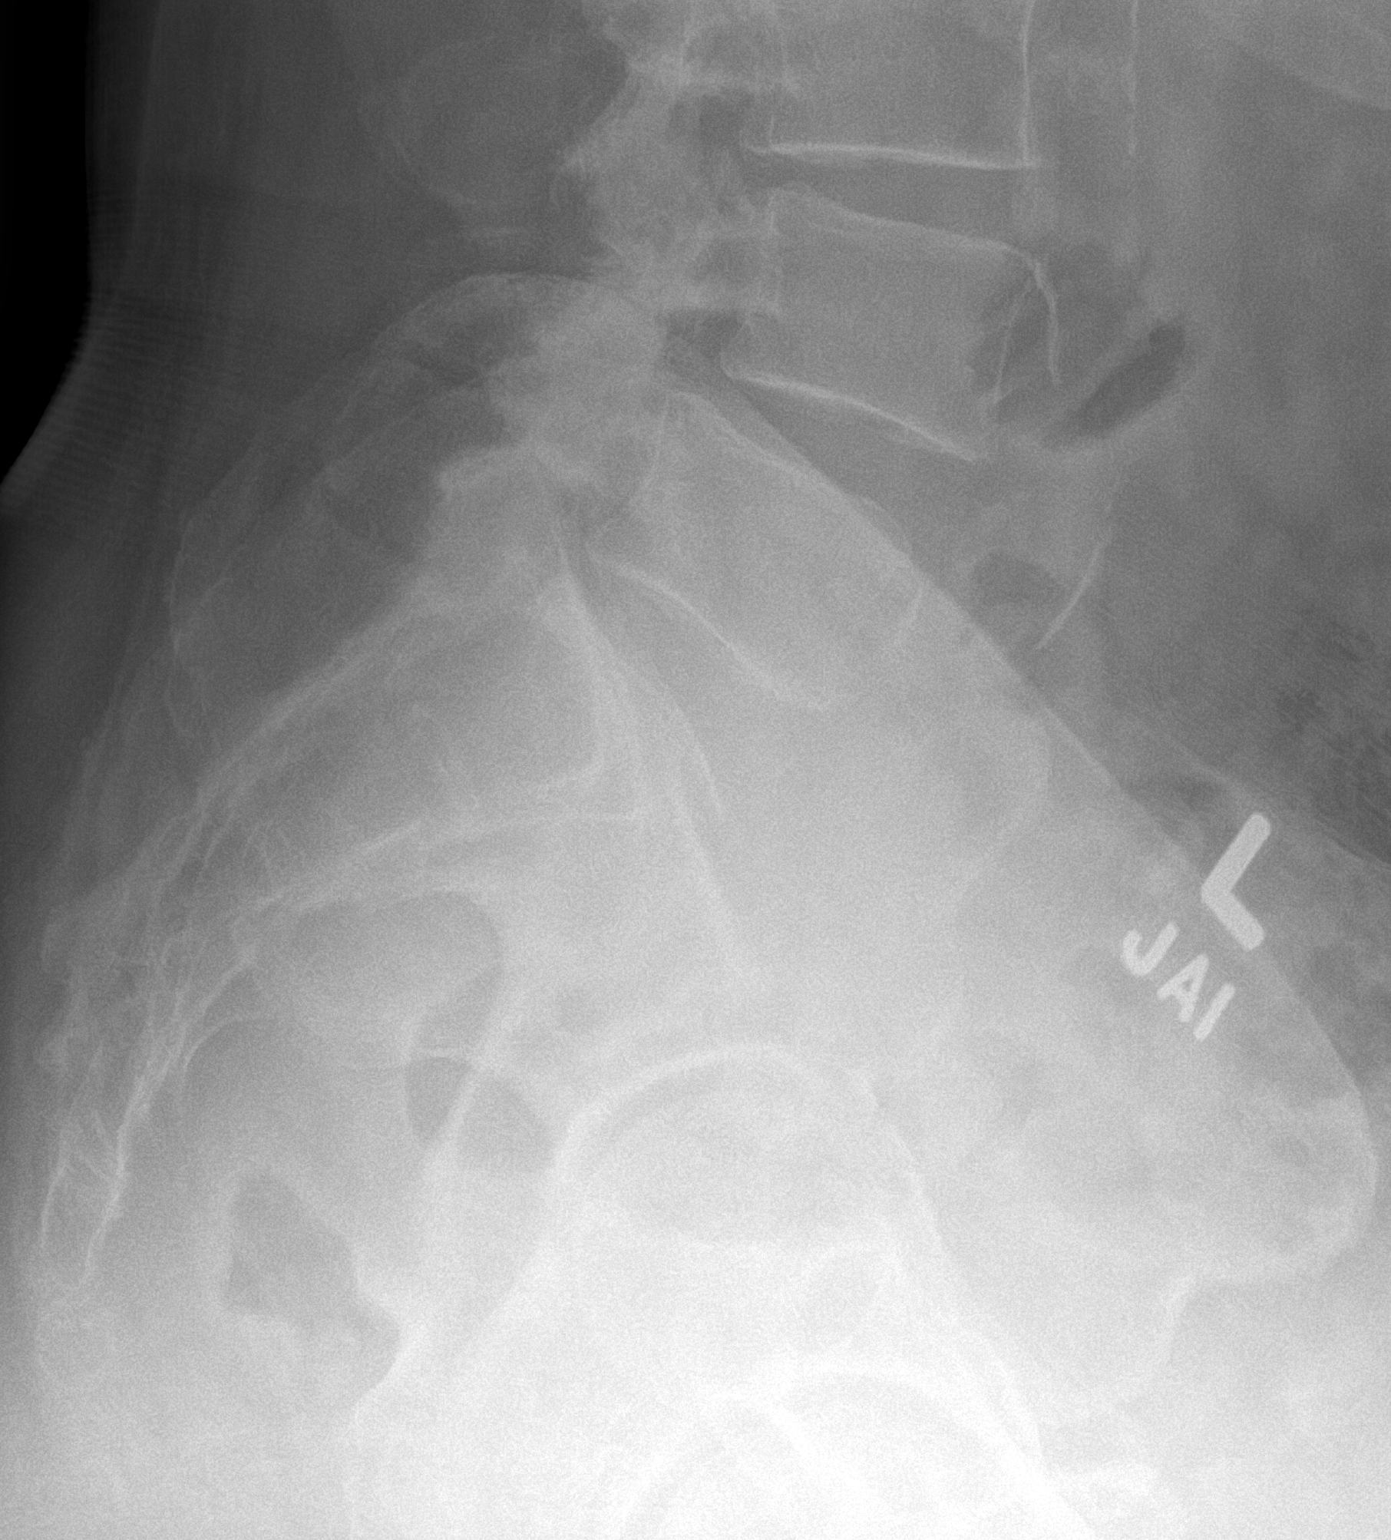

[3 of 3 positions shown; findings below may reference images not displayed]

FINDINGS: Frontal and lateral views of the lumbar spine are obtained. There
are 5 non-rib-bearing lumbar type vertebral bodies identified. Grade
1 anterolisthesis of L4 on L5 is likely due to significant facet
hypertrophy. No definite pars defect. Otherwise alignment is
anatomic.

There is mild disc space narrowing at the T12/L1 level. Remaining
disc spaces are well preserved. There is extensive multilevel facet
hypertrophy, greatest at L3-4, L4-5 common L5-S1. Sacroiliac joints
are unremarkable.
IMPRESSION: 1. Significant lower lumbar facet hypertrophy.
2. Mild grade 1 anterolisthesis of L4 on L5.

## 2023-12-27 ENCOUNTER — Emergency Department (HOSPITAL_COMMUNITY)
Admission: EM | Admit: 2023-12-27 | Discharge: 2023-12-27 | Disposition: A | Discharge status: Home / Self Care | Payer: Self-pay

## 2023-12-27 ENCOUNTER — Encounter (HOSPITAL_COMMUNITY): Payer: Self-pay

## 2023-12-27 ENCOUNTER — Other Ambulatory Visit: Payer: Self-pay

## 2023-12-27 DIAGNOSIS — Z760 Encounter for issue of repeat prescription: Secondary | ICD-10-CM | POA: Insufficient documentation

## 2023-12-27 DIAGNOSIS — I10 Essential (primary) hypertension: Secondary | ICD-10-CM

## 2023-12-27 DIAGNOSIS — Z7982 Long term (current) use of aspirin: Secondary | ICD-10-CM | POA: Insufficient documentation

## 2023-12-27 MED ORDER — BISOPROLOL FUMARATE 5 MG PO TABS: 5.0000 mg | ORAL_TABLET | Freq: Every day | ORAL | 0 refills | Status: AC

## 2023-12-27 NOTE — ED Provider Notes (Signed)
 [place] EMERGENCY DEPARTMENT AT [facility] Provider Note   CSN: [id number] Arrival date & time: 12/27/23  1433     Patient presents with: Medication Refill  HPI Andrew Murray is a 68 y.o. male presenting for med refill of his bisoprolol .  He states he has been on it for several years for blood pressure.  Has not had a chance to refill it with his primary care doctor.  No other complaints at this time.    Medication Refill      Prior to Admission medications   Medication Sig Start Date End Date Taking? Authorizing Provider  aspirin EC 81 MG tablet Take 81 mg by mouth daily.    [provider]  Bacillus Coagulans-Inulin (PROBIOTIC-PREBIOTIC PO) Take by mouth.    [provider]  bisoprolol  (ZEBETA ) 5 MG tablet Take 1 tablet (5 mg total) by mouth daily for 15 days. 12/27/23 01/11/24  Lang Norleen POUR, PA-C  cetirizine  (ZYRTEC ) 10 MG tablet Take 1 tablet (10 mg total) by mouth daily. 02/03/20   Purcell Emil Schanz, MD  dicyclomine  (BENTYL ) 10 MG capsule Take 1 capsule (10 mg total) by mouth every 8 (eight) hours as needed for spasms. 11/24/20   Purcell Emil Schanz, MD  gabapentin  (NEURONTIN ) 300 MG capsule Take 1 capsule (300 mg total) by mouth 3 (three) times daily as needed. 11/28/20   Joane Artist RAMAN, MD  losartan  (COZAAR ) 50 MG tablet Take 1 tablet (50 mg total) by mouth daily. 02/15/20   O'NealDarryle Ned, MD  rosuvastatin  (CRESTOR ) 20 MG tablet Take 1 tablet (20 mg total) by mouth daily. 02/08/20   Purcell Emil Schanz, MD    Allergies: Sulfonamide derivatives    Review of Systems See HPI  Updated Vital Signs BP (!) 165/92   Pulse 72   Temp 98 F (36.7 C)   Resp 19   Ht 5' 8 (1.727 m)   Wt 90 kg   SpO2 100%   BMI 30.17 kg/m   Physical Exam Constitutional:      Appearance: Normal appearance.  HENT:     Head: Normocephalic.     Nose: Nose normal.  Eyes:     Conjunctiva/sclera: Conjunctivae normal.  Pulmonary:      Effort: Pulmonary effort is normal.  Neurological:     Mental Status: He is alert.  Psychiatric:        Mood and Affect: Mood normal.     (all labs ordered are listed, but only abnormal results are displayed) Labs Reviewed - No data to display  EKG: None  Radiology: No results found.   Procedures   Medications Ordered in the ED - No data to display                                  Medical Decision Making  68 year old well-appearing male presenting for medication refill.  Reviewed his chart does appear he has been on bisoprolol  for a few years.  Sent 15 tablets to his pharmacy and advised him to follow-up with his PCP.     Final diagnoses:  Medication refill    ED Discharge Orders          Ordered    bisoprolol  (ZEBETA ) 5 MG tablet  Daily        12/27/23 1452               Lang Norleen POUR, PA-C 12/27/23 1453  Neysa Caron PARAS, DO 12/27/23 1546

## 2023-12-27 NOTE — ED Triage Notes (Signed)
 Patient has been out of bisoprolol  5mg  tablet. Ran out today. Requesting refill. Denies pain.

## 2023-12-27 NOTE — Discharge Instructions (Signed)
 Encounter today was reassuring.  I sent 15 tablets of your bisoprolol  to your pharmacy.  As we discussed please follow-up with your primary care doctor for further refills.
